# Patient Record
Sex: Female | Born: 2000 | Hispanic: Yes | Marital: Single | Smoking: Never smoker
Health system: Southern US, Community
[De-identification: ages and names within clinical notes are randomized; demographics above are authoritative.]

## PROBLEM LIST (undated history)

## (undated) DIAGNOSIS — E282 Polycystic ovarian syndrome: Secondary | ICD-10-CM

## (undated) HISTORY — DX: Polycystic ovarian syndrome: E28.2

---
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
## 2020-02-09 ENCOUNTER — Other Ambulatory Visit: Payer: Self-pay | Admitting: Nurse Practitioner

## 2020-02-09 DIAGNOSIS — R102 Pelvic and perineal pain: Secondary | ICD-10-CM

## 2020-02-17 ENCOUNTER — Other Ambulatory Visit: Payer: Self-pay

## 2020-07-21 ENCOUNTER — Ambulatory Visit (INDEPENDENT_AMBULATORY_CARE_PROVIDER_SITE_OTHER): Payer: Self-pay | Admitting: Obstetrics and Gynecology

## 2020-07-21 ENCOUNTER — Encounter: Payer: Self-pay | Admitting: *Deleted

## 2020-07-21 ENCOUNTER — Other Ambulatory Visit: Payer: Self-pay

## 2020-07-21 VITALS — BP 115/69 | HR 83 | Ht 62.0 in | Wt 193.1 lb

## 2020-07-21 DIAGNOSIS — Z3A01 Less than 8 weeks gestation of pregnancy: Secondary | ICD-10-CM

## 2020-07-21 DIAGNOSIS — E282 Polycystic ovarian syndrome: Secondary | ICD-10-CM

## 2020-07-21 LAB — POCT PREGNANCY, URINE: Preg Test, Ur: POSITIVE — AB

## 2020-07-21 NOTE — Progress Notes (Signed)
Patient was assessed and managed by nursing staff during this encounter. I have reviewed the chart and agree with the documentation and plan. I have also made any necessary editorial changes.  Patient advised to take prenatal vitamins and to consume a healthy diet. Patient to establish care to start prenatal care  Belinda Antigua, MD 07/21/2020 3:18 PM

## 2020-07-21 NOTE — Progress Notes (Signed)
Here for pregnancy test which was positive. States LMP 06/12/20 and is sure of that date. This makes her [redacted]w[redacted]d with EDD 03/19/21. She is not taking any medications.Advised take PNV. Advised to start prenatal vitamins. Dr.Constant in to see patient since not seen in our office before. Rodarius Kichline,RN

## 2020-07-21 NOTE — Patient Instructions (Signed)
Prenatal Care Providers           [facility]  [address] [phone]  [facility]   [address]  [phone]  [facility] @ [place]       [address] [phone]            [facility] @ [place]     [address] [phone]          [facility] @ [place]   [address] [phone]  [facility]  [address] [phone]     [facility])  [address]   [phone]     [facility]  Phone: [phone]  [facility]  Phone: [phone]  [facility] Phone: [phone]  [facility] Phone: [phone]  [facility] Phone: [phone]  [facility] Phone: [phone]  [facility]  Phone: [phone]  

## 2020-07-26 ENCOUNTER — Encounter: Payer: Self-pay | Admitting: Advanced Practice Midwife

## 2020-07-26 DIAGNOSIS — Z3480 Encounter for supervision of other normal pregnancy, unspecified trimester: Secondary | ICD-10-CM | POA: Insufficient documentation

## 2020-08-26 ENCOUNTER — Other Ambulatory Visit (HOSPITAL_COMMUNITY)
Admission: RE | Admit: 2020-08-26 | Discharge: 2020-08-26 | Disposition: A | Discharge status: Home / Self Care | Payer: Self-pay | Source: Ambulatory Visit | Attending: Advanced Practice Midwife | Admitting: Advanced Practice Midwife

## 2020-08-26 ENCOUNTER — Encounter: Payer: Self-pay | Admitting: Family Medicine

## 2020-08-26 ENCOUNTER — Ambulatory Visit (INDEPENDENT_AMBULATORY_CARE_PROVIDER_SITE_OTHER): Payer: Self-pay | Admitting: Family Medicine

## 2020-08-26 ENCOUNTER — Other Ambulatory Visit: Payer: Self-pay

## 2020-08-26 VITALS — BP 122/72 | HR 81 | Wt 193.0 lb

## 2020-08-26 DIAGNOSIS — Z3480 Encounter for supervision of other normal pregnancy, unspecified trimester: Secondary | ICD-10-CM | POA: Insufficient documentation

## 2020-08-26 DIAGNOSIS — Z9049 Acquired absence of other specified parts of digestive tract: Secondary | ICD-10-CM | POA: Insufficient documentation

## 2020-08-26 DIAGNOSIS — Z23 Encounter for immunization: Secondary | ICD-10-CM

## 2020-08-26 DIAGNOSIS — E282 Polycystic ovarian syndrome: Secondary | ICD-10-CM

## 2020-08-26 LAB — POCT URINALYSIS DIP (DEVICE)
Bilirubin Urine: NEGATIVE
Glucose, UA: NEGATIVE mg/dL
Hgb urine dipstick: NEGATIVE
Ketones, ur: NEGATIVE mg/dL
Nitrite: NEGATIVE
Protein, ur: NEGATIVE mg/dL
Specific Gravity, Urine: 1.02 (ref 1.005–1.030)
Urobilinogen, UA: 0.2 mg/dL (ref 0.0–1.0)
pH: 7 (ref 5.0–8.0)

## 2020-08-26 MED ORDER — PROMETHAZINE HCL 12.5 MG PO TABS
12.5000 mg | ORAL_TABLET | Freq: Four times a day (QID) | ORAL | 1 refills | Status: DC | PRN
Start: 2020-08-26 — End: 2020-10-21

## 2020-08-26 NOTE — Progress Notes (Signed)
   Subjective:  Belinda May is a 19 y.o. G1P0000 at [redacted]w[redacted]d being seen today for ongoing prenatal care.  She is currently monitored for the following issues for this low-risk pregnancy and has PCOS (polycystic ovarian syndrome); Supervision of other normal pregnancy, antepartum; and History of appendectomy on their problem list.  Patient reports nausea.  Contractions: Not present. Vag. Bleeding: None.  Movement: Absent. Denies leaking of fluid.   The following portions of the patient's history were reviewed and updated as appropriate: allergies, current medications, past family history, past medical history, past social history, past surgical history and problem list. Problem list updated.  Objective:   Vitals:   08/26/20 1034  BP: 122/72  Pulse: 81  Weight: 193 lb (87.5 kg)    Fetal Status: Fetal Heart Rate (bpm): 172   Movement: Absent     General:  Alert, oriented and cooperative. Patient is in no acute distress.  Skin: Skin is warm and dry. No rash noted.   Cardiovascular: Normal heart rate noted  Respiratory: Normal respiratory effort, no problems with respiration noted  Abdomen: Soft, gravid, appropriate for gestational age. Pain/Pressure: Absent     Pelvic: Vag. Bleeding: None     Cervical exam deferred        Extremities: Normal range of motion.  Edema: None  Mental Status: Normal mood and affect. Normal behavior. Normal judgment and thought content.   Urinalysis:      Assessment and Plan:  Pregnancy: G1P0000 at [redacted]w[redacted]d  1. Supervision of other normal pregnancy, antepartum Unplanned but desired pregnancy  Reviewed structure of Faculty practice with patient, that they may not always see the same provider, and that physicians, fellows, CNM, NP, PA, residents, and medical students may all be involved in their care. Made aware we only deliver at [facility], and to go to entrance C if needs to go to hospital Accepts genetic screening Accepts flu vaccine today Reports she  had COVID vaccine in March/April of this year, discuss booster at next visit Anatomy scan ordered - CBC/D/Plt+RPR+Rh+ABO+Rub Ab... - Genetic Screening - GC/Chlamydia probe amp ([place])not at [facility] - Culture, OB Urine - [place] MFM OB COMP + 14 WK; Future  2. History of appendectomy   3. PCOS (polycystic ovarian syndrome)   Preterm labor symptoms and general obstetric precautions including but not limited to vaginal bleeding, contractions, leaking of fluid and fetal movement were reviewed in detail with the patient. Please refer to After Visit Summary for other counseling recommendations.  Return in 4 weeks (on 09/23/2020) for [facility], ob visit.   Venora Maples, MD

## 2020-08-26 NOTE — Patient Instructions (Signed)
Primer trimestre de Psychiatrist First Trimester of Pregnancy El primer trimestre de Psychiatrist se extiende desde la semana1 hasta el final de la semana13 (mes1 al mes3). Una semana despus de que un espermatozoide fecunda un vulo, este se implantar en la pared uterina. Este embrin comenzar a Camera operator convertirse en un beb. Sus genes y los de su pareja forman el beb. Los genes del varn determinan si ser un nio o una nia. Entre la semana6 y [place], se forman los ojos y Recruitment consultant, y los latidos del corazn pueden verse en una ecografa. Al final de las 12semanas, todos los rganos del beb estn formados. Ahora que est embarazada, querr hacer todo lo que est a su alcance para tener un beb sano. Dos de las cosas ms importantes son Wilburt Finlay un buen cuidado prenatal y seguir las indicaciones del mdico. El cuidado prenatal incluye toda la asistencia mdica que usted recibe antes del nacimiento del beb. Esto ayudar a Radio producer, Engineer, manufacturing y tratar cualquier problema durante el embarazo y [place]. Durante Financial risk analyst trimestre, ocurren cambios en el cuerpo. Su organismo atraviesa por muchos cambios durante el [place]. Estos cambios varan de [place] a [place].  Al principio, puede aumentar o bajar algunos kilos.  Puede tener Programme researcher, broadcasting/film/video (nuseas) y vomitar. Si no puede controlar los vmitos, llame al mdico.  Puede cansarse con facilidad.  Es posible que tenga dolores de cabeza que pueden aliviarse con ciertos medicamentos. Todos los United Parcel que tome deben estar aprobados por el mdico.  Puede orinar con mayor frecuencia. El dolor al orinar puede significar que usted tiene una infeccin de la vejiga.  Debido al Vanetta Mulders, puede tener acidez estomacal.  Puede estar estreida, ya que ciertas hormonas enlentecen los movimientos de los msculos que empujan las heces a travs del intestino.  Pueden aparecer hemorroides o hincharse las venas (venas varicosas).  Las ConAgra Foods  pueden empezar a Government social research officer y Emergency planning/management officer. Los pezones pueden sobresalir ms, y el tejido que los rodea (arola) tornarse ms oscuro.  Las Veterinary surgeon y estar sensibles al cepillado y al hilo dental.  Pueden aparecer zonas oscuras o manchas (cloasma, mscara del [place]) en el rostro. Esto probablemente se atenuar despus del nacimiento del beb.  Los perodos menstruales se interrumpirn.  Tal vez no tenga apetito.  Puede sentir un fuerte deseo de consumir ciertos alimentos.  Puede tener cambios a Theatre manager a da, por ejemplo, por momentos puede estar emocionada por el Psychiatrist y por otros preocuparse porque algo pueda salir mal con el embarazo o el beb.  Tendr sueos ms vvidos y extraos.  Tal vez haya cambios en el cabello. Esto cambios pueden incluir su engrosamiento, crecimiento rpido y Allied Waste Industries textura. Adems, a algunas mujeres se les cae el cabello durante o despus del embarazo, o tienen el cabello seco o fino. Lo ms probable es que el cabello se le normalice despus del nacimiento del beb. Qu debe esperar en las visitas prenatales Durante una visita prenatal de rutina:  La pesarn para asegurarse de que usted y el beb estn creciendo normalmente.  Le tomarn la presin arterial.  Le medirn el abdomen para controlar el desarrollo del beb.  Se escucharn los latidos cardacos fetales entre las semanas10 y14 de [place].  Se analizarn los resultados de los estudios solicitados en visitas anteriores. El mdico puede preguntarle lo siguiente:  Cmo se siente.  Si siente los movimientos del beb.  Si ha tenido sntomas anormales, como prdida de lquido,  sangrado, dolores de cabeza intensos o clicos abdominales.  Si est consumiendo algn producto que contenga tabaco, como cigarrillos, tabaco de Theatre managermascar y Administrator, Civil Servicecigarrillos electrnicos.  Si tiene Colgate-Palmolivealguna pregunta. Otros estudios que pueden realizarse durante el primer trimestre  incluyen lo siguiente:  Anlisis de sangre para determinar el grupo sanguneo y Engineer, manufacturingdetectar la presencia de infecciones previas. Las pruebas tambin se utilizarn para Chief Strategy Officerdeterminar si tiene bajo nivel de hierro (anemia) y protenas en los glbulos rojos (anticuerpos Rh). En funcin de sus factores de riesgo, o si ya tuvo diabetes durante un embarazo anterior, le pueden hacer pruebas para determinar si tiene un nivel alto de azcar en la sangre, algo que puede afectar a embarazadas (diabetes gestacional).  Anlisis de orina para detectar infecciones, diabetes o protenas en la orina.  Una ecografa para confirmar que el beb crece y se desarrolla correctamente.  Estudios fetales para Engineer, manufacturingdetectar problemas de la mdula espinal (espina bfida) y sndrome de [place].  Prueba del VIH (virus de inmunodeficiencia humana). Los exmenes prenatales de rutina incluyen la prueba de deteccin del VIH, a menos que decida no Futures traderrealizrsela.  Es posible que necesite otras pruebas adicionales. Siga estas instrucciones en su casa: Medicamentos  Siga las indicaciones del mdico en relacin con el uso de medicamentos. Durante el embarazo, hay medicamentos que pueden tomarse y otros que no.  Tome vitaminas prenatales que contengan por lo menos 600microgramos (?g) de cido flico.  Si est estreida, tome un laxante suave, si el mdico lo autoriza. Comida y bebida   Esperanza RichtersLleve una dieta equilibrada que incluya gran cantidad de frutas y verduras frescas, cereales integrales, buenas fuentes de protenas como carnes Doylinemagras, huevos o tofu, y lcteos descremados. El mdico la ayudar a Production assistant, radiodeterminar la cantidad de peso que puede Altamontaumentar.  No coma carne cruda ni quesos sin cocinar. Estos elementos contienen grmenes que pueden causar defectos congnitos en el beb.  La ingesta diaria de cuatro o cinco comidas pequeas en lugar de tres comidas abundantes puede ayudar a Yahooaliviar las nuseas y los vmitos. Si empieza a tener nuseas,  comer algunas [address] Avenuegalletas saladas puede ser de Knoxayuda. Beber lquidos Altria Groupentre las comidas, Teacher, English as a foreign languageen lugar de tomarlos durante las comidas, tambin puede ayudar a Paramedicaliviar las nuseas y los vmitos.  Limite el consumo de alimentos con alto contenido de grasas y azcares procesados, como alimentos fritos o dulces.  Para evitar el estreimiento: ? Consuma alimentos ricos en fibra, como frutas y verduras frescas, cereales integrales y frijoles. ? Beba suficiente lquido para mantener la orina clara o de color amarillo plido. Actividad  Haga ejercicio solamente como se lo haya indicado el mdico. La mayora de las mujeres pueden continuar su rutina de ejercicios durante el Watagaembarazo. Intente realizar como mnimo 30minutos de actividad fsica por lo menos 5das a la semana. El ejercicio la ayudar a: ? Engineering geologistControlar su peso. ? Mantenerse en forma. ? Estar preparada para el trabajo de parto y Leeds Pointel parto.  Los dolores, los clicos en la parte baja del abdomen o los calambres en la cintura son un buen indicio de que debe dejar de Corporate treasurerhacer ejercicios. Consulte al mdico antes de seguir haciendo ejercicios con normalidad.  Intente no estar de pie FedExdurante mucho tiempo. Mueva las piernas con frecuencia si debe estar de pie en un lugar durante mucho tiempo.  Evite levantar pesos Fortune Brandsexcesivos.  Use zapatos de tacones bajos y Brazilmantenga una buena postura.  Puede seguir teniendo The St. Paul Travelersrelaciones sexuales, salvo que el mdico le indique lo contrario. Alivio del dolor y del  malestar  Use un sostn que le brinde buen soporte para Engineer, materials de [place].  Dese baos de asiento con agua tibia para Engineer, materials o las molestias causadas por las hemorroides. Use una crema para las hemorroides si el mdico la autoriza.  Descanse con las piernas elevadas si tiene calambres o dolor de cintura.  Si tiene venas varicosas en las piernas, use medias de descanso. Eleve los pies durante , 3 o 4veces por da. Limite el consumo de  sal en su dieta. Cuidados prenatales  Programe las visitas prenatales para la semana12 de [place]. Generalmente se programan cada mes al principio y se hacen ms frecuentes en los 2 ltimos meses antes del parto.  Escriba sus preguntas. Llvelas cuando concurra a las visitas prenatales.  Concurra a todas las visitas prenatales tal como se lo haya indicado el mdico. Esto es importante. Seguridad  Use el cinturn de seguridad en todo momento mientras conduce.  Haga una lista de los nmeros de telfono de Associate Professor, que [employer] nmeros de telfono de familiares, [place], el hospital y los departamentos de polica y bomberos. Instrucciones generales  Pdale al mdico que la derive a clases de educacin prenatal en su localidad. Debe comenzar a tomar las clases antes de que empiece el mes6 de [place].  Pida ayuda si tiene necesidades nutricionales o de asesoramiento Academic librarian. El mdico puede aconsejarla o derivarla a especialistas para que la ayuden con diferentes necesidades.  No se d baos de inmersin en agua caliente, baos turcos ni saunas.  No se haga duchas vaginales ni use tampones o toallas higinicas perfumadas.  No mantenga las piernas cruzadas durante [place].  Evite el contacto con las bandejas sanitarias de los gatos y la tierra que estos animales usan. Estos elementos contienen bacterias que pueden causar defectos congnitos al beb y la posible prdida del feto debido a un aborto espontneo o muerte fetal.  No fume, no consuma hierbas ni medicamentos que no hayan sido recetados por el mdico. Las sustancias qumicas que estos productos contienen afectan la formacin y el desarrollo del beb.  No consuma ningn producto que contenga nicotina o tabaco, como cigarrillos y Administrator, Civil Service. Si necesita ayuda para dejar de fumar, consulte al American Express. Puede recibir asesoramiento y otro tipo de recursos para dejar de fumar.  Programe una cita con el  dentista. En su casa, lvese los dientes con un cepillo dental blando y psese el hilo dental con suavidad. Comunquese con un mdico si:  Tiene mareos.  Siente clicos leves, presin en la pelvis o dolor persistente en el abdomen.  Tiene nuseas, vmitos o diarrea persistentes.  Brett Fairy secrecin vaginal con mal olor.  Siente dolor al ConocoPhillips.  Observa ms hinchazn en la cara, las manos, las piernas o los tobillos.  Est expuesta a la quinta enfermedad o a la varicela.  Est expuesta al sarampin alemn (rubola) y nunca lo haba tenido. Solicite ayuda de inmediato si:  Tiene fiebre.  Tiene una prdida de lquido por la vagina.  Tiene sangrado o pequeas prdidas vaginales.  Siente dolor intenso o clicos en el abdomen.  Sube o baja de peso rpidamente.  Vomita sangre de color rojo brillante o una sustancia similar a los granos de caf.  Dolor de cabeza intenso.  Le falta el aire.  Sufre cualquier tipo de traumatismo, por ejemplo, debido a una cada o un accidente automovilstico. Resumen  El primer trimestre de Psychiatrist se extiende desde la semana1 hasta el final  de la semana13 (mes1 al mes3).  Su organismo atraviesa por muchos cambios durante el [place]. Estos cambios varan de [place] a [place].  Tendr visitas prenatales de rutina. Durante esas visitas, el mdico la examinar, hablar con usted acerca de los resultados de sus pruebas y Network engineer cmo se siente. Esta informacin no tiene Theme park manager el consejo del mdico. Asegrese de hacerle al mdico cualquier pregunta que tenga. Document Revised: 11/30/2016 Document Reviewed: 11/30/2016 Elsevier Patient Education  2020 [employer].   [employer] del mtodo anticonceptivo Contraception Choices La anticoncepcin, o los mtodos anticonceptivos, hace referencia a los mtodos o dispositivos que evitan el [place]. Mtodos hormonales Implante anticonceptivo  Un implante anticonceptivo  consiste en un tubo plstico delgado que contiene una hormona. Se inserta en la parte superior del brazo. Puede Consulting civil engineer por 3 aos. Inyecciones de progestina sola Las inyecciones de progestina sola contienen progestina, una forma sinttica de la hormona progesterona. Un mdico las administra cada 3 meses. Pldoras anticonceptivas  Las pldoras anticonceptivas son pastillas que contienen hormonas que evitan el [place]. Deben tomarse una vez al da, preferentemente a la misma Economist. Parches anticonceptivos  El parche anticonceptivo contiene hormonas que evitan el [place]. Se coloca en la piel, debe cambiarse una vez a la semana durante tres semanas y debe retirarse en la cuarta semana. Se necesita una receta para utilizar este mtodo anticonceptivo. Anillo vaginal  Un anillo vaginal contiene hormonas que evitan el embarazo. Se coloca en la vagina durante tres semanas y se retira en la cuarta semana. Luego se repite el proceso con un anillo nuevo. Se necesita una receta para utilizar este mtodo anticonceptivo. Anticonceptivo de emergencia Los anticonceptivos de emergencia son mtodos para evitar un embarazo despus de Warehouse manager sexo sin proteccin. Vienen en forma de pldora y pueden tomarse hasta 5 das despus de [place]. Funcionan mejor cuando se toman lo ms pronto posible luego de eBay. La mayora de los anticonceptivos de emergencia estn disponibles sin receta mdica. Este mtodo no debe utilizarse como el nico mtodo anticonceptivo. Mtodos de barrera Preservativo masculino  Un preservativo masculino es una vaina delgada que se coloca sobre el pene durante el sexo. Los preservativos evitan que el esperma ingrese en el cuerpo de la [place]. Pueden utilizarse con un espermicida para aumentar la efectividad. Deben desecharse luego de su uso. Preservativo femenino  Un preservativo femenino es una vaina blanda y holgada que se coloca en la vagina antes de [place]. El preservativo evita que el esperma ingrese en el cuerpo de la [place]. Deben desecharse luego de su uso. Diafragma  Un diafragma es una barrera blanda con forma de cpula. Se inserta en la vagina antes del sexo, junto con un espermicida. El diafragma bloquea el ingreso de esperma en el tero, y el espermicida mata a los espermatozoides. El Designer, fashion/clothing en la vagina durante 6 a 8 horas despus de Warehouse manager sexo y debe retirarse en el plazo de las 24 horas. Un diafragma es recetado y colocado por un mdico. Debe reemplazarse cada 1 a 2 aos, despus de dar a luz, de aumentar ms de 15lb (6,8kg) y de [place] plvica. Capuchn cervical  Un capuchn cervical es una copa redonda y blanda de ltex o plstico que se coloca en el cuello uterino. Se inserta en la vagina antes del sexo, junto con un espermicida. Bloquea el ingreso del esperma en el tero. El capuchn Radio producer durante 6  a 8 horas despus de tener sexo y debe retirarse en el plazo de las 48 horas. Un capuchn cervical debe ser recetado y colocado por un mdico. Debe reemplazarse cada 2aos. Esponja  Una esponja es una pieza blanda y circular de espuma de poliuretano que contiene espermicida. La esponja ayuda a bloquear el ingreso de esperma en el tero, y el espermicida mata a los espermatozoides. Belva Bertin, debe humedecerla e insertarla en la vagina. Debe insertarse antes de eBay, debe permanecer dentro al menos durante 6 horas despus de tener sexo y debe retirarse y Nurse, adult en el plazo de las 30 horas. Espermicidas Los espermicidas son sustancias qumicas que matan o bloquean al esperma y no lo dejan ingresar al cuello uterino y al tero. Vienen en forma de crema, gel, supositorio, espuma o comprimido. Un espermicida debe insertarse en la vagina con un aplicador al menos 10 o 15 minutos antes de tener sexo para dar tiempo a que surta [place]. El proceso debe repetirse cada vez que tenga sexo.  Los espermicidas no requieren Emergency planning/management officer. Anticonceptivos intrauterinos Dispositivo intrauterino (DIU). Un DIU es un dispositivo en forma de T que se coloca en el tero. Existen dos tipos:  DIU hormonal.Este tipo contiene progestina, una forma sinttica de la hormona progesterona. Este tipo puede permanecer colocado durante 3 a 5 aos.  DIU de cobre.Este tipo est recubierto con un alambre de cobre. Puede permanecer colocado durante 10 aos.  Mtodos anticonceptivos permanentes Ligadura de trompas en la mujer En este mtodo, se sellan, atan u obstruyen las trompas de Falopio durante una ciruga para Automotive engineer que el vulo descienda [place]. Esterilizacin histeroscpica En este mtodo, se coloca un implante pequeo y flexible dentro de cada trompa de Falopio. Los implantes hacen que se forme un tejido cicatricial en las trompas de Falopio y que las obstruya para que el espermatozoide no pueda llegar al vulo. El procedimiento demora alrededor de 3 meses para que sea [place]. Debe utilizarse otro mtodo anticonceptivo durante esos 3 meses. Esterilizacin masculina Este es un procedimiento que consiste en atar los conductos que transportan el esperma (vasectoma). Luego del procedimiento, el hombre Manufacturing engineer lquido (semen). Mtodos de planificacin natural Planificacin familiar natural En este mtodo, la pareja no tiene [employer] Family [employer] la mujer podra quedar [place]. Mtodo calendario Marketing executive un seguimiento de la duracin de cada ciclo menstrual, identificar los [employer] que se puede producir un Psychiatrist y no Warehouse manager sexo durante esos [place]. Mtodo de la ovulacin En este mtodo, la pareja evita tener sexo durante la ovulacin. Mtodo sintotrmico Este mtodo implica no tener sexo durante la ovulacin. Normalmente, la mujer comprueba la ovulacin al observar cambios en su temperatura y en la consistencia del moco cervical. Mtodo posovulacin En  este mtodo, la pareja espera a que finalice la ovulacin para Doctor, hospital. Resumen  La anticoncepcin, o los mtodos anticonceptivos, hace referencia a los mtodos o dispositivos que evitan el [place].  Los mtodos anticonceptivos hormonales incluyen implantes, inyecciones, pastillas, parches, anillos vaginales y anticonceptivos de Associate Professor.  Los mtodos anticonceptivos de barrera pueden incluir preservativos masculinos, preservativos femeninos, diafragmas, capuchones cervicales, esponjas y espermicidas.  Guardian Life Insurance tipos de DIU (dispositivo intrauterino). Un DIU puede colocarse en el tero de una mujer para evitar el embarazo durante 3 a 5 aos.  La esterilizacin permanente puede realizarse mediante un procedimiento para hombres, mujeres o ambos.  Los [employer]ger de Medical sales representative natural incluyen no Doctor, hospital [employer] Electric Power en  que la mujer podra quedar [place]. Esta informacin no tiene Theme park manager el consejo del mdico. Asegrese de hacerle al mdico cualquier pregunta que tenga. Document Revised: 09/28/2017 Document Reviewed: 12/17/2016 Elsevier Patient Education  2020 Elsevier Inc.   [place] materna Breastfeeding  Decidir amamantar es una de las mejores elecciones que puede hacer por usted y su beb. Un cambio en las hormonas durante el embarazo hace que las mamas produzcan leche materna en las glndulas productoras de [place]. Las hormonas impiden que la leche materna sea liberada antes del nacimiento del beb. Adems, impulsan el flujo de leche luego del nacimiento. Una vez que ha comenzado a Museum/gallery exhibitions officer, Conservation officer, nature beb, as Immunologist succin o Theatre manager, pueden estimular la liberacin de [place] [employer] de las glndulas productoras de [place]. Los beneficios de [employer] International investigaciones demuestran que la lactancia materna ofrece muchos beneficios de salud para bebs y [place]. Adems, ofrece una forma gratuita y conveniente de Corporate treasurer al beb. Para el beb  La  primera leche (calostro) ayuda a Careers information officer funcionamiento del aparato digestivo del beb.  Las clulas especiales de la leche (anticuerpos) ayudan a Artist las infecciones en el beb.  Los bebs que se alimentan con leche materna tambin tienen menos probabilidades de tener asma, alergias, obesidad o diabetes de tipo 2. Adems, tienen menor riesgo de sufrir el sndrome de muerte sbita del lactante (SMSL).  Los nutrientes de la [place] materna son mejores para Patent examiner las necesidades del beb en comparacin con la [employer].  La leche materna mejora el desarrollo cerebral del beb. Para usted  La lactancia materna favorece el desarrollo de un vnculo muy especial entre la madre y el beb.  Es conveniente. La leche materna es econmica y siempre est disponible a la Human resources officer.  La lactancia materna ayuda a quemar caloras. Claude [place]anges a perder el peso ganado durante el [place].  Hace que el tero vuelva al tamao que tena antes del embarazo ms rpido. Adems, disminuye el sangrado (loquios) despus del parto.  La lactancia materna contribuye a reducir Nurse, adult de tener diabetes de tipo 2, osteoporosis, artritis reumatoide, enfermedades cardiovasculares y cncer de mama, ovario, tero y endometrio en el futuro. Informacin bsica sobre la lactancia Comienzo de la lactancia  Encuentre un lugar cmodo para sentarse o Teacher, music, con un buen respaldo para el cuello y la espalda.  Coloque una almohada o una manta enrollada debajo del beb para acomodarlo a la altura de la mama (si est sentada). Las almohadas para Museum/gallery exhibitions officer se han diseado especialmente a fin de servir de apoyo para los brazos y el beb [employer] Foods.  Asegrese de que la barriga del beb (abdomen) est frente a la suya.  [place] suavemente la mama. Con las yemas de los dedos, Liberty Media bordes exteriores de la mama hacia adentro, en direccin al pezn. Esto estimula el flujo de [place]. Si la Kimberly[employer]ark, es posible que deba Educational psychologist con este movimiento durante la Market researcher.  Sostenga la mama con 4 dedos por debajo y Multimedia programmer por arriba del pezn (forme la letra "C" con la mano). Asegrese de que los dedos se encuentren lejos del pezn y de la boca del beb.  Empuje suavemente los labios del beb con el pezn o con el dedo.  Cuando la boca del beb se abra lo suficiente, acrquelo rpidamente a la mama e introduzca todo el pezn y la arola, tanto como sea posible, dentro de la boca del beb. La arola es la  zona de color que rodea al pezn. ? Debe haber ms arola visible por arriba del labio superior del beb que por debajo del labio inferior. ? Los labios del beb deben estar abiertos y extendidos hacia afuera (evertidos) para asegurar que el beb se prenda de forma adecuada y cmoda. ? La lengua del beb debe estar entre la enca inferior y Educational psychologistla mama.  Asegrese de que la boca del beb est en la posicin correcta alrededor del pezn (prendido). Los labios del beb deben crear un sello sobre la mama y estar doblados hacia afuera (invertidos).  Es comn que el beb succione durante 2 a 3 minutos para que comience el flujo de Minongleche materna. Cmo debe prenderse Es muy importante que le ensee al beb cmo prenderse adecuadamente a la mama. Si el beb no se prende adecuadamente, puede causar Federated Department Storesdolor en los pezones, reducir la produccin de [place]leche materna y Radio producerhacer que el beb tenga un escaso aumento de Redlandspeso. Adems, si el beb no se prende adecuadamente al pezn, puede tragar aire durante la alimentacin. Esto puede causarle molestias al beb. Hacer eructar al beb al Pilar Platecambiar de mama puede ayudarlo a liberar el aire. Sin embargo, ensearle al beb cmo prenderse a la mama adecuadamente es la mejor manera de evitar que se sienta molesto por tragar Oceanographeraire mientras se alimenta. Signos de que el beb se ha prendido adecuadamente al pezn  Tironea o succiona de modo silencioso, sin Publishing copycausarle  dolor. Los labios del beb deben estar extendidos hacia afuera (evertidos).  Se escucha que traga cada 3 o 4 succiones una vez que la WPS Resourcesleche ha comenzado a Radiographer, therapeuticfluir (despus de que se produzca el reflejo de eyeccin de la [place]).  Hay movimientos musculares por arriba y por delante de sus odos al Printmakersuccionar. Signos de que el beb no se ha prendido Audiological scientistadecuadamente al pezn  Hace ruidos de succin o de chasquido mientras se Tree surgeonalimenta.  Siente dolor en los pezones. Si cree que el beb no se prendi correctamente, deslice el dedo en la comisura de la boca y Ameren Corporationcolquelo entre las encas del beb para interrumpir la succin. Intente volver a comenzar a Museum/gallery exhibitions officeramamantar. Signos de Market researcherlactancia materna exitosa Signos del beb  El beb disminuir gradualmente el nmero de succiones o dejar de succionar por completo.  El beb se quedar dormido.  El cuerpo del beb se relajar.  El beb retendr Neomia Dearuna pequea cantidad de Kindred Healthcareleche en la boca.  El beb se desprender solo del [place]. Signos que presenta usted  Las mamas han aumentado la firmeza, el peso y el tamao 1 a 3 horas despus de Museum/gallery exhibitions officeramamantar.  Estn ms blandas inmediatamente despus de amamantar.  Se producen un aumento del volumen de [place] y un cambio en su consistencia y color hacia el quinto da de Market researcherlactancia.  Los pezones no duelen, no estn agrietados ni sangran. Signos de que su beb recibe la cantidad de leche suficiente  Mojar por lo menos 1 o 2paales durante las primeras 24horas despus del nacimiento.  Mojar por lo menos 5 o 6paales cada 24horas durante la primera semana despus del nacimiento. La orina debe ser clara o de color amarillo plido a los 5das de vida.  Mojar entre 6 y 8paales cada 24horas a medida que el beb sigue creciendo y desarrollndose.  Defeca por lo menos 3 veces en 24 horas a los 5 [address]  [address] de [address]. Las heces deben ser blandas y Armed forces operational officeramarillentas.  Defeca por lo menos 3 veces en 24 horas a los  7 das de [address] Las heces  deben ser grumosas y Armed forces operational officer.  No registra una prdida de peso mayor al 10% del peso al nacer durante los primeros 3 [address]  [address] de [place].  Aumenta de peso un promedio de 4 a 7onzas (113 a 198g) por semana despus de los 4 [address]  [address] de vida.  Aumenta de [place], [place], de [place]um uniforme a Glass blower/designer de los 5 [address]  [address] de vida, sin Passenger transport manager prdida de peso despus de las 2semanas de vida. Despus de alimentarse, es posible que el beb regurgite una pequea cantidad de [place]. Esto es normal. Frecuencia y duracin de la lactancia El amamantamiento frecuente la ayudar a producir ms [place] y puede prevenir dolores en los pezones y las mamas extremadamente llenas (congestin [place]). Alimente al beb cuando muestre signos de hambre o si siente la necesidad de reducir la congestin de las [place]. Esto se denomina "lactancia a demanda". Las seales de que el beb tiene hambre incluyen las siguientes:  Aumento del [place] de [place], [place] o inquietud.  Mueve la cabeza de un lado a otro.  Abre la boca cuando se le toca la mejilla o la comisura de la boca (reflejo de bsqueda).  Aumenta las vocalizaciones, tales como sonidos de succin, se relame los labios, emite arrullos, suspiros o chirridos.  Mueve la [employer] boca y se chupa los dedos o las manos.  Est molesto o llora. Evite el uso del chupete en las primeras 4 a 6 semanas despus del nacimiento del beb. Despus de este perodo, podr usar un chupete. Las investigaciones demostraron que el uso del chupete durante Financial risk analyst ao de vida del beb disminuye el riesgo de tener el sndrome de muerte sbita del lactante (SMSL). Permita que el nio se alimente en cada mama todo lo que desee. Cuando el beb se desprende o se queda dormido mientras se est alimentando de la primera mama, ofrzcale la segunda. Debido a que, con frecuencia, los recin nacidos estn somnolientos las primeras semanas de vida, es posible que deba despertar al beb para  alimentarlo. Los horarios de Acupuncturist de un beb a otro. Sin embargo, las siguientes reglas pueden servir como gua para ayudarla a Lawyer que el beb se alimenta adecuadamente:  Se puede amamantar a los recin nacidos (bebs de 4 semanas o menos de vida) cada 1 a 3 horas.  No deben transcurrir ms de 3 horas durante el da o 5 horas durante la noche sin que se amamante a los recin nacidos.  Debe amamantar al beb un mnimo de 8 veces en un perodo de 24 horas. Extraccin de [employer] Companies extraccin y Contractor de la leche materna le permiten asegurarse de que el beb se alimente exclusivamente de su leche materna, aun en momentos en los que no puede Museum/gallery exhibitions officer. Esto tiene especial importancia si debe regresar al Aleen Campi en el perodo en que an est amamantando o si no puede estar presente en los momentos en que el beb debe alimentarse. Su asesor en lactancia puede ayudarla a Clinical research associate un mtodo de extraccin que funcione mejor para usted y Programmer, systems cunto tiempo es seguro almacenar [place]. Cmo cuidar las mamas durante la lactancia Los pezones pueden secarse, Lobbyist y doler durante la Market researcher. Las siguientes recomendaciones pueden ayudarla a Pharmacologist las [employer] y sanas:  Careers information officer usar jabn en los pezones.  Use un sostn de soporte diseado especialmente para la lactancia materna. Evite usar sostenes con aro o sostenes muy ajustados (sostenes  deportivos).  Seque al aire sus pezones durante 3 a despus de amamantar al beb.  Utilice solo apsitos de Haematologist sostn para Environmental health practitioner las prdidas de [place]. La prdida de un poco de [employer] tomas es normal.  Utilice lanolina sobre los pezones luego de Museum/gallery exhibitions officer. La lanolina ayuda a mantener la humedad normal de la piel. La lanolina pura no es perjudicial (no es txica) para el beb. Adems, puede extraer [employer] algunas gotas de [place] materna y Engineer, maintenance (IT)  suavemente esa [employer] pezones para que la [place] se seque al aire. Durante las primeras semanas despus del nacimiento, algunas mujeres experimentan Streetman. La congestin [place] [employer] puede hacer que sienta las mamas pesadas, calientes y sensibles al tacto. El pico de la congestin mamaria ocurre en el plazo de los 3 a 5 das despus del [place]. Las siguientes recomendaciones pueden ayudarla a Paramedic la congestin mamaria:  Vace por completo las mamas al [employer] o Environmental health practitioner. Puede aplicar calor hmedo en las mamas (en la ducha o con toallas hmedas para manos) antes de Museum/gallery exhibitions officer o extraer [employer]. Esto aumenta la circulacin y [place] a que la [place]. Si el beb no vaca por completo las [address] cuando lo [address], extraiga la [place] restante despus de que haya finalizado.  Aplique compresas de hielo [employer] inmediatamente despus de Museum/gallery exhibitions officer o extraer [place], a menos que le resulte demasiado incmodo. Haga lo siguiente: ? Ponga el hielo en una bolsa plstica. ? Coloque una [employer] piel y la bolsa de hielo. ? Coloque el hielo durante , 2 o 3veces por da.  Asegrese de que el beb est prendido y se encuentre en la posicin correcta mientras lo alimenta. Si la congestin mamaria persiste luego de 48 horas o despus de seguir estas recomendaciones, comunquese con su mdico o un Holiday representative. Recomendaciones de salud general durante la lactancia  Consuma 3 comidas y 3 colaciones saludables todos los [place]. Las [employer] Holdings bien alimentadas que amamantan necesitan entre 450 y 500 caloras adicionales por Futures trader. Puede cumplir con este requisito al aumentar la cantidad de una dieta equilibrada que realice.  Beba suficiente agua para mantener la orina clara o de color amarillo plido.  Descanse con frecuencia, reljese y siga tomando sus vitaminas prenatales para prevenir la fatiga, el estrs y los niveles bajos de vitaminas y [employer] en el cuerpo  (deficiencias de nutrientes).  No consuma ningn producto que contenga nicotina o tabaco, como cigarrillos y Administrator, Civil Service. El beb puede verse afectado por las sustancias qumicas de los cigarrillos que pasan a la [place] materna y por la exposicin al humo ambiental del tabaco. Si necesita ayuda para dejar de fumar, consulte al mdico.  Evite el consumo de alcohol.  No consuma drogas ilegales o marihuana.  Antes de Dietitian, hable con el mdico. Estos incluyen medicamentos recetados y de [place], como tambin vitaminas y suplementos a base de hierbas. Algunos medicamentos, que pueden ser perjudiciales para el beb, pueden pasar a travs de la [employer].  Puede quedar embarazada durante la lactancia. Si se desea un mtodo anticonceptivo, consulte al mdico sobre cules son las opciones seguras durante la Market researcher. Dnde encontrar ms informacin: Liga internacional [place]: [email or web address] Comunquese con un mdico si:  Siente que quiere dejar de Museum/gallery exhibitions officer o se siente frustrada con la lactancia.  Sus pezones estn agrietados o Water quality scientist.  Sus mamas estn irritadas, sensibles o calientes.  Tiene los siguientes sntomas: ? TEFL teacher  las [employer] o en los pezones. ? Un rea hinchada en cualquiera de las mamas. ? Grant Ruts o escalofros. ? Nuseas o vmitos. ? Drenaje de otro lquido distinto de la [employer] materna desde los pezones.  Sus mamas no se llenan antes de Museum/gallery exhibitions officer al beb para el quinto da despus del [place].  Se siente triste y deprimida.  El beb: ? Est demasiado somnoliento como para comer bien. ? Tiene problemas para dormir. ? Tiene ms de 1 semana de vida y [employer] de 6 paales en un periodo de 24 horas. ? No ha aumentado de [place] a los [address] de [address]  El beb defeca menos de 3 veces en 24 horas.  La piel del beb o las partes blancas de los ojos se vuelven amarillentas. Solicite ayuda de inmediato si:  El beb est muy cansado  Retail buyer) y no se quiere despertar para comer.  Le sube la fiebre sin causa. Resumen  La lactancia materna ofrece muchos beneficios de salud para bebs y [place].  Intente amamantar a su beb cuando muestre signos tempranos de hambre.  Haga cosquillas o empuje suavemente los labios del beb con el dedo o el pezn para lograr que el beb abra la boca. Acerque el beb a la mama. Asegrese de que la mayor parte de la arola se encuentre dentro de la boca del beb. Ofrzcale una mama y haga eructar al beb antes de pasar a la otra.  Hable con su mdico o asesor en lactancia si tiene dudas o problemas con la lactancia. Esta informacin no tiene Theme park manager el consejo del mdico. Asegrese de hacerle al mdico cualquier pregunta que tenga. Document Revised: 11/21/2017 Document Reviewed: 12/17/2016 Elsevier Patient Education  2020 [employer].

## 2020-08-27 LAB — CBC/D/PLT+RPR+RH+ABO+RUB AB...
Antibody Screen: NEGATIVE
Basophils Absolute: 0 10*3/uL (ref 0.0–0.2)
Basos: 0 %
EOS (ABSOLUTE): 0.1 10*3/uL (ref 0.0–0.4)
Eos: 1 %
HCV Ab: <0.1 s/co ratio (ref 0.0–0.9)
HIV Screen 4th Generation wRfx: NONREACTIVE
Hematocrit: 40.2 % (ref 34.0–46.6)
Hemoglobin: 13.4 g/dL (ref 11.1–15.9)
Hepatitis B Surface Ag: NEGATIVE
Immature Grans (Abs): 0 10*3/uL (ref 0.0–0.1)
Immature Granulocytes: 0 %
Lymphocytes Absolute: 1.9 10*3/uL (ref 0.7–3.1)
Lymphs: 23 %
MCH: 29.1 pg (ref 26.6–33.0)
MCHC: 33.3 g/dL (ref 31.5–35.7)
MCV: 87 fL (ref 79–97)
Monocytes Absolute: 0.6 10*3/uL (ref 0.1–0.9)
Monocytes: 7 %
Neutrophils Absolute: 5.6 10*3/uL (ref 1.4–7.0)
Neutrophils: 69 %
Platelets: 261 10*3/uL (ref 150–450)
RBC: 4.6 x10E6/uL (ref 3.77–5.28)
RDW: 13.7 % (ref 11.7–15.4)
RPR Ser Ql: NONREACTIVE
Rh Factor: POSITIVE
Rubella Antibodies, IGG: 2.12 index (ref >0.99)
WBC: 8.3 10*3/uL (ref 3.4–10.8)

## 2020-08-27 LAB — HCV INTERPRETATION

## 2020-08-28 LAB — URINE CULTURE, OB REFLEX

## 2020-08-28 LAB — CULTURE, OB URINE

## 2020-08-29 LAB — GC/CHLAMYDIA PROBE AMP (~~LOC~~) NOT AT ARMC
Chlamydia: NEGATIVE
Comment: NEGATIVE
Comment: NORMAL
Neisseria Gonorrhea: NEGATIVE

## 2020-09-06 ENCOUNTER — Encounter: Payer: Self-pay | Admitting: Family Medicine

## 2020-09-23 ENCOUNTER — Other Ambulatory Visit: Payer: Self-pay

## 2020-09-23 ENCOUNTER — Ambulatory Visit (INDEPENDENT_AMBULATORY_CARE_PROVIDER_SITE_OTHER): Payer: Self-pay | Admitting: Family Medicine

## 2020-09-23 VITALS — BP 115/82 | HR 105 | Wt 186.5 lb

## 2020-09-23 DIAGNOSIS — Z3480 Encounter for supervision of other normal pregnancy, unspecified trimester: Secondary | ICD-10-CM

## 2020-09-23 DIAGNOSIS — E282 Polycystic ovarian syndrome: Secondary | ICD-10-CM

## 2020-09-23 NOTE — Progress Notes (Signed)
Pt states having light pink spotting x 1 one week ago. Nothing now.

## 2020-09-23 NOTE — Progress Notes (Signed)
   PRENATAL VISIT NOTE  Subjective:  Belinda May is a 20 y.o. G1P0000 at [redacted]w[redacted]d being seen today for ongoing prenatal care.  She is currently monitored for the following issues for this low-risk pregnancy and has PCOS (polycystic ovarian syndrome); Supervision of other normal pregnancy, antepartum; and History of appendectomy on their problem list.  Patient reports no complaints.  Contractions: Not present. Vag. Bleeding: None.  Movement: Absent. Denies leaking of fluid.   The following portions of the patient's history were reviewed and updated as appropriate: allergies, current medications, past family history, past medical history, past social history, past surgical history and problem list.   Objective:   Vitals:   09/23/20 0849  BP: 115/82  Pulse: (!) 105  Weight: 186 lb 8 oz (84.6 kg)    Fetal Status: Fetal Heart Rate (bpm): 154   Movement: Absent     General:  Alert, oriented and cooperative. Patient is in no acute distress.  Skin: Skin is warm and dry. No rash noted.   Cardiovascular: Normal heart rate noted  Respiratory: Normal respiratory effort, no problems with respiration noted  Abdomen: Soft, gravid, appropriate for gestational age.  Pain/Pressure: Present     Pelvic: Cervical exam deferred        Extremities: Normal range of motion.  Edema: None  Mental Status: Normal mood and affect. Normal behavior. Normal judgment and thought content.   Assessment and Plan:  Pregnancy: G1P0000 at [redacted]w[redacted]d  1. Supervision of other normal pregnancy, antepartum Still some nausea. Wants to continue with current medications  2. PCOS (polycystic ovarian syndrome)   Preterm labor symptoms and general obstetric precautions including but not limited to vaginal bleeding, contractions, leaking of fluid and fetal movement were reviewed in detail with the patient. Please refer to After Visit Summary for other counseling recommendations.   No follow-ups on file.  Future  Appointments  Date Time Provider Department Center  10/28/2020 10:45 AM [facility]    Levie Heritage, DO

## 2020-10-17 ENCOUNTER — Telehealth: Payer: Self-pay

## 2020-10-17 NOTE — Telephone Encounter (Signed)
Created GFE for pt and made it available via MyChart.

## 2020-10-21 ENCOUNTER — Encounter: Payer: Self-pay | Admitting: Family Medicine

## 2020-10-21 ENCOUNTER — Other Ambulatory Visit: Payer: Self-pay

## 2020-10-21 ENCOUNTER — Ambulatory Visit (INDEPENDENT_AMBULATORY_CARE_PROVIDER_SITE_OTHER): Payer: Self-pay | Admitting: Family Medicine

## 2020-10-21 VITALS — BP 128/72 | HR 91 | Wt 185.7 lb

## 2020-10-21 DIAGNOSIS — K2970 Gastritis, unspecified, without bleeding: Secondary | ICD-10-CM | POA: Insufficient documentation

## 2020-10-21 DIAGNOSIS — Z3480 Encounter for supervision of other normal pregnancy, unspecified trimester: Secondary | ICD-10-CM

## 2020-10-21 MED ORDER — PROMETHAZINE HCL 12.5 MG PO TABS: 12.5000 mg | ORAL_TABLET | Freq: Four times a day (QID) | ORAL | 1 refills | Status: DC | PRN

## 2020-10-21 MED ORDER — FAMOTIDINE 20 MG PO TABS: 20.0000 mg | ORAL_TABLET | Freq: Two times a day (BID) | ORAL | 9 refills | Status: DC

## 2020-10-21 NOTE — Patient Instructions (Signed)
 Eleccin del mtodo anticonceptivo Contraception Choices La anticoncepcin, o los mtodos anticonceptivos, hace referencia a los mtodos o dispositivos que evitan el embarazo. Mtodos hormonales Implante anticonceptivo Un implante anticonceptivo consiste en un tubo delgado de plstico que contiene una hormona que evita el embarazo. Es diferente de un dispositivo intrauterino (DIU). Un mdico lo inserta en la parte superior del brazo. Los implantes pueden ser eficaces durante un mximo de 3 aos. Inyecciones de progestina sola Las inyecciones de progestina sola contienen progestina, una forma sinttica de la hormona progesterona. Un mdico las administra cada 3 meses. Pldoras anticonceptivas Las pldoras anticonceptivas son pastillas que contienen hormonas que evitan el embarazo. Deben tomarse una vez al da, preferentemente a la misma hora cada da. Se necesita una receta para utilizar este mtodo anticonceptivo. Parche anticonceptivo El parche anticonceptivo contiene hormonas que evitan el embarazo. Se coloca en la piel, debe cambiarse una vez a la semana durante tres semanas y debe retirarse en la cuarta semana. Se necesita una receta para utilizar este mtodo anticonceptivo. Anillo vaginal Un anillo vaginal contiene hormonas que evitan el embarazo. Se coloca en la vagina durante tres semanas y se retira en la cuarta semana. Luego se repite el proceso con un anillo nuevo. Se necesita una receta para utilizar este mtodo anticonceptivo. Anticonceptivo de emergencia Los anticonceptivos de emergencia son mtodos para evitar un embarazo despus de tener sexo sin proteccin. Vienen en forma de pldora y pueden tomarse hasta 5 das despus de tener sexo. Funcionan mejor cuando se toman lo ms pronto posible luego de tener sexo. La mayora de los anticonceptivos de emergencia estn disponibles sin receta mdica. Este mtodo no debe utilizarse como el nico mtodo anticonceptivo.   Mtodos de  barrera Condn masculino Un condn masculino es una vaina delgada que se coloca sobre el pene durante el sexo. Los condones evitan que el esperma ingrese en el cuerpo de la mujer. Pueden utilizarse con un una sustancia que mata a los espermatozoides (espermicida) para aumentar la efectividad. Deben desecharse despus de un uso. Condn femenino Un condn femenino es una vaina blanda y holgada que se coloca en la vagina antes de tener sexo. El condn evita que el esperma ingrese en el cuerpo de la mujer. Deben desecharse despus de un uso. Diafragma Un diafragma es una barrera blanda con forma de cpula. Se inserta en la vagina antes del sexo, junto con un espermicida. El diafragma bloquea el ingreso de esperma en el tero, y el espermicida mata a los espermatozoides. El diafragma debe permanecer en la vagina durante 6 a 8 horas despus de tener sexo y debe retirarse en el plazo de las 24 horas. Un diafragma es recetado y colocado por un mdico. Debe reemplazarse cada 1 a 2 aos, despus de dar a luz, de aumentar ms de 15lb (6.8kg) y de una ciruga plvica. Capuchn cervical Un capuchn cervical es una copa redonda y blanda de ltex o plstico que se coloca en el cuello uterino. Se inserta en la vagina antes del sexo, junto con un espermicida. Bloquea el ingreso del esperma en el tero. El capuchn debe permanecer en el lugar durante 6 a 8 horas despus de tener sexo y debe retirarse en el plazo de las 48 horas. Un capuchn cervical debe ser recetado y colocado por un mdico. Debe reemplazarse cada 2aos. Esponja Una esponja es una pieza blanda y circular de espuma de poliuretano que contiene espermicida. La esponja ayuda a bloquear el ingreso de esperma en el tero, y el   espermicida mata a los espermatozoides. Para utilizarla, debe humedecerla e insertarla en la vagina. Debe insertarse antes de tener sexo, debe permanecer dentro al menos durante 6 horas despus de tener sexo y debe retirarse y  desecharse en el plazo de las 30 horas. Espermicidas Los espermicidas son sustancias qumicas que matan o bloquean al esperma y no lo dejan ingresar al cuello uterino y al tero. Vienen en forma de crema, gel, supositorio, espuma o comprimido. Un espermicida debe insertarse en la vagina con un aplicador al menos 10 o 15 minutos antes de tener sexo para dar tiempo a que surta efecto. El proceso debe repetirse cada vez que tenga sexo. Los espermicidas no requieren receta mdica.   Anticonceptivos intrauterinos Dispositivo intrauterino (DIU) Un DIU es un dispositivo en forma de T que se coloca en el tero. Existen dos tipos:  DIU hormonal.Este tipo contiene progestina, una forma sinttica de la hormona progesterona. Este tipo puede permanecer colocado durante 3 a 5 aos.  DIU de cobre.Este tipo est recubierto con un alambre de cobre. Puede permanecer colocado durante 10 aos. Mtodos anticonceptivos permanentes Ligadura de trompas en la mujer En este mtodo, se sellan, atan u obstruyen las trompas de Falopio durante una ciruga para evitar que el vulo descienda hacia el tero. Esterilizacin histeroscpica En este mtodo, se coloca un implante pequeo y flexible dentro de cada trompa de Falopio. Los implantes hacen que se forme un tejido cicatricial en las trompas de Falopio y que las obstruya para que el espermatozoide no pueda llegar al vulo. El procedimiento demora alrededor de 3 meses para que sea efectivo. Debe utilizarse otro mtodo anticonceptivo durante esos 3 meses. Esterilizacin masculina Este es un procedimiento que consiste en atar los conductos que transportan el esperma (vasectoma). Luego del procedimiento, el hombre puede eyacular lquido (semen). Debe utilizarse otro mtodo anticonceptivo durante 3 meses despus del procedimiento. Mtodos de planificacin natural Planificacin familiar natural En este mtodo, la pareja no tiene sexo durante los das en que la mujer podra quedar  embarazada. Mtodo calendario En este mtodo, la mujer realiza un seguimiento de la duracin de cada ciclo menstrual, identifica los das en los que se puede producir un embarazo y no tiene sexo durante esos das. Mtodo de la ovulacin En este mtodo, la pareja evita tener sexo durante la ovulacin. Mtodo sintotrmico Este mtodo implica no tener sexo durante la ovulacin. Normalmente, la mujer comprueba la ovulacin al observar cambios en su temperatura y en la consistencia del moco cervical. Mtodo posovulacin En este mtodo, la pareja espera a que finalice la ovulacin para tener sexo. Dnde buscar ms informacin  Centers for Disease Control and Prevention (Centros para el Control y la Prevencin de Enfermedades): www.cdc.gov Resumen  La anticoncepcin, o los mtodos anticonceptivos, hace referencia a los mtodos o dispositivos que evitan el embarazo.  Los mtodos anticonceptivos hormonales incluyen implantes, inyecciones, pastillas, parches, anillos vaginales y anticonceptivos de emergencia.  Los mtodos anticonceptivos de barrera pueden incluir condones masculinos, condones femeninos, diafragmas, capuchones cervicales, esponjas y espermicidas.  Existen dos tipos de DIU (dispositivo intrauterino). Un DIU puede colocarse en el tero de una mujer para evitar el embarazo durante 3 a 5 aos.  La esterilizacin permanente puede realizarse mediante un procedimiento tanto en los hombres como en las mujeres. Los mtodos de planificacin familiar natural implican no tener sexo durante los das en que la mujer podra quedar embarazada. Esta informacin no tiene como fin reemplazar el consejo del mdico. Asegrese de hacerle al mdico cualquier pregunta que   tenga. Document Revised: 03/29/2020 Document Reviewed: 03/29/2020 Elsevier Patient Education  2021 [employer].   Lactancia materna Breastfeeding  Decidir amamantar es una de las mejores elecciones que puede hacer por usted y su  beb. Un cambio en las hormonas durante el embarazo hace que las mamas produzcan leche materna en las glndulas productoras de leche. Las hormonas impiden que la leche materna sea liberada antes del nacimiento del beb. Adems, impulsan el flujo de leche luego del nacimiento. Una vez que ha comenzado a amamantar, pensar en el beb, as como la succin o el llanto, pueden estimular la liberacin de leche de las glndulas productoras de leche. Los beneficios de amamantar Las investigaciones demuestran que la lactancia materna ofrece muchos beneficios de salud para bebs y madres. Adems, ofrece una forma gratuita y conveniente de alimentar al beb. Para el beb  La primera leche (calostro) ayuda a mejorar el funcionamiento del aparato digestivo del beb.  Las clulas especiales de la leche (anticuerpos) ayudan a combatir las infecciones en el beb.  Los bebs que se alimentan con leche materna tambin tienen menos probabilidades de tener asma, alergias, obesidad o diabetes de tipo 2. Adems, tienen menor riesgo de sufrir el sndrome de muerte sbita del lactante (SMSL).  Los nutrientes de la leche materna son mejores para satisfacer las necesidades del beb en comparacin con la leche maternizada.  La leche materna mejora el desarrollo cerebral del beb. Para usted  La lactancia materna favorece el desarrollo de un vnculo muy especial entre la madre y el beb.  Es conveniente. La leche materna es econmica y siempre est disponible a la temperatura correcta.  La lactancia materna ayuda a quemar caloras. Le ayuda a perder el peso ganado durante el embarazo.  Hace que el tero vuelva al tamao que tena antes del embarazo ms rpido. Adems, disminuye el sangrado (loquios) despus del parto.  La lactancia materna contribuye a reducir el riesgo de tener diabetes de tipo 2, osteoporosis, artritis reumatoide, enfermedades cardiovasculares y cncer de mama, ovario, tero y endometrio en el  futuro. Informacin bsica sobre la lactancia Comienzo de la lactancia  Encuentre un lugar cmodo para sentarse o acostarse, con un buen respaldo para el cuello y la espalda.  Coloque una almohada o una manta enrollada debajo del beb para acomodarlo a la altura de la mama (si est sentada). Las almohadas para amamantar se han diseado especialmente a fin de servir de apoyo para los brazos y el beb mientras amamanta.  Asegrese de que la barriga del beb (abdomen) est frente a la suya.  Masajee suavemente la mama. Con las yemas de los dedos, masajee los bordes exteriores de la mama hacia adentro, en direccin al pezn. Esto estimula el flujo de leche. Si la leche fluye lentamente, es posible que deba continuar con este movimiento durante la lactancia.  Sostenga la mama con 4 dedos por debajo y el pulgar por arriba del pezn (forme la letra "C" con la mano). Asegrese de que los dedos se encuentren lejos del pezn y de la boca del beb.  Empuje suavemente los labios del beb con el pezn o con el dedo.  Cuando la boca del beb se abra lo suficiente, acrquelo rpidamente a la mama e introduzca todo el pezn y la arola, tanto como sea posible, dentro de la boca del beb. La arola es la zona de color que rodea al pezn. ? Debe haber ms arola visible por arriba del labio superior del beb que por debajo del labio   inferior. ? Los labios del beb deben estar abiertos y extendidos hacia afuera (evertidos) para asegurar que el beb se prenda de forma adecuada y cmoda. ? La lengua del beb debe estar entre la enca inferior y la mama.  Asegrese de que la boca del beb est en la posicin correcta alrededor del pezn (prendido). Los labios del beb deben crear un sello sobre la mama y estar doblados hacia afuera (invertidos).  Es comn que el beb succione durante 2 a 3 minutos para que comience el flujo de leche materna. Cmo debe prenderse Es muy importante que le ensee al beb cmo  prenderse adecuadamente a la mama. Si el beb no se prende adecuadamente, puede causar dolor en los pezones, reducir la produccin de leche materna y hacer que el beb tenga un escaso aumento de peso. Adems, si el beb no se prende adecuadamente al pezn, puede tragar aire durante la alimentacin. Esto puede causarle molestias al beb. Hacer eructar al beb al cambiar de mama puede ayudarlo a liberar el aire. Sin embargo, ensearle al beb cmo prenderse a la mama adecuadamente es la mejor manera de evitar que se sienta molesto por tragar aire mientras se alimenta. Signos de que el beb se ha prendido adecuadamente al pezn  Tironea o succiona de modo silencioso, sin causarle dolor. Los labios del beb deben estar extendidos hacia afuera (evertidos).  Se escucha que traga cada 3 o 4 succiones una vez que la leche ha comenzado a fluir (despus de que se produzca el reflejo de eyeccin de la leche).  Hay movimientos musculares por arriba y por delante de sus odos al succionar. Signos de que el beb no se ha prendido adecuadamente al pezn  Hace ruidos de succin o de chasquido mientras se alimenta.  Siente dolor en los pezones. Si cree que el beb no se prendi correctamente, deslice el dedo en la comisura de la boca y colquelo entre las encas del beb para interrumpir la succin. Intente volver a comenzar a amamantar. Signos de lactancia materna exitosa Signos del beb  El beb disminuir gradualmente el nmero de succiones o dejar de succionar por completo.  El beb se quedar dormido.  El cuerpo del beb se relajar.  El beb retendr una pequea cantidad de leche en la boca.  El beb se desprender solo del pecho. Signos que presenta usted  Las mamas han aumentado la firmeza, el peso y el tamao 1 a 3 horas despus de amamantar.  Estn ms blandas inmediatamente despus de amamantar.  Se producen un aumento del volumen de leche y un cambio en su consistencia y color hacia el  quinto da de lactancia.  Los pezones no duelen, no estn agrietados ni sangran. Signos de que su beb recibe la cantidad de leche suficiente  Mojar por lo menos 1 o 2paales durante las primeras 24horas despus del nacimiento.  Mojar por lo menos 5 o 6paales cada 24horas durante la primera semana despus del nacimiento. La orina debe ser clara o de color amarillo plido a los 5das de vida.  Mojar entre 6 y 8paales cada 24horas a medida que el beb sigue creciendo y desarrollndose.  Defeca por lo menos 3 veces en 24 horas a los 5 das de vida. Las heces deben ser blandas y amarillentas.  Defeca por lo menos 3 veces en 24 horas a los 7 das de vida. Las heces deben ser grumosas y amarillentas.  No registra una prdida de peso mayor al 10% del peso al   nacer durante los primeros 3 das de vida.  Aumenta de peso un promedio de 4 a 7onzas (113 a 198g) por semana despus de los 4 das de vida.  Aumenta de peso, diariamente, de manera uniforme a partir de los 5 das de vida, sin registrar prdida de peso despus de las 2semanas de vida. Despus de alimentarse, es posible que el beb regurgite una pequea cantidad de leche. Esto es normal. Frecuencia y duracin de la lactancia El amamantamiento frecuente la ayudar a producir ms leche y puede prevenir dolores en los pezones y las mamas extremadamente llenas (congestin mamaria). Alimente al beb cuando muestre signos de hambre o si siente la necesidad de reducir la congestin de las mamas. Esto se denomina "lactancia a demanda". Las seales de que el beb tiene hambre incluyen las siguientes:  Aumento del estado de alerta, actividad o inquietud.  Mueve la cabeza de un lado a otro.  Abre la boca cuando se le toca la mejilla o la comisura de la boca (reflejo de bsqueda).  Aumenta las vocalizaciones, tales como sonidos de succin, se relame los labios, emite arrullos, suspiros o chirridos.  Mueve la mano hacia la boca y se chupa  los dedos o las manos.  Est molesto o llora. Evite el uso del chupete en las primeras 4 a 6 semanas despus del nacimiento del beb. Despus de este perodo, podr usar un chupete. Las investigaciones demostraron que el uso del chupete durante el primer ao de vida del beb disminuye el riesgo de tener el sndrome de muerte sbita del lactante (SMSL). Permita que el nio se alimente en cada mama todo lo que desee. Cuando el beb se desprende o se queda dormido mientras se est alimentando de la primera mama, ofrzcale la segunda. Debido a que, con frecuencia, los recin nacidos estn somnolientos las primeras semanas de vida, es posible que deba despertar al beb para alimentarlo. Los horarios de lactancia varan de un beb a otro. Sin embargo, las siguientes reglas pueden servir como gua para ayudarla a garantizar que el beb se alimenta adecuadamente:  Se puede amamantar a los recin nacidos (bebs de 4 semanas o menos de vida) cada 1 a 3 horas.  No deben transcurrir ms de 3 horas durante el da o 5 horas durante la noche sin que se amamante a los recin nacidos.  Debe amamantar al beb un mnimo de 8 veces en un perodo de 24 horas. Extraccin de leche materna La extraccin y el almacenamiento de la leche materna le permiten asegurarse de que el beb se alimente exclusivamente de su leche materna, aun en momentos en los que no puede amamantar. Esto tiene especial importancia si debe regresar al trabajo en el perodo en que an est amamantando o si no puede estar presente en los momentos en que el beb debe alimentarse. Su asesor en lactancia puede ayudarla a encontrar un mtodo de extraccin que funcione mejor para usted y orientarla sobre cunto tiempo es seguro almacenar leche materna.      Cmo cuidar las mamas durante la lactancia Los pezones pueden secarse, agrietarse y doler durante la lactancia. Las siguientes recomendaciones pueden ayudarla a mantener las mamas humectadas y  sanas:  Evite usar jabn en los pezones.  Use un sostn de soporte diseado especialmente para la lactancia materna. Evite usar sostenes con aro o sostenes muy ajustados (sostenes deportivos).  Seque al aire sus pezones durante 3 a 4minutos despus de amamantar al beb.  Utilice solo apsitos de algodn en   el sostn para absorber las prdidas de leche. La prdida de un poco de leche materna entre las tomas es normal.  Utilice lanolina sobre los pezones luego de amamantar. La lanolina ayuda a mantener la humedad normal de la piel. La lanolina pura no es perjudicial (no es txica) para el beb. Adems, puede extraer manualmente algunas gotas de leche materna y masajear suavemente esa leche sobre los pezones para que la leche se seque al aire. Durante las primeras semanas despus del nacimiento, algunas mujeres experimentan congestin mamaria. La congestin mamaria puede hacer que sienta las mamas pesadas, calientes y sensibles al tacto. El pico de la congestin mamaria ocurre en el plazo de los 3 a 5 das despus del parto. Las siguientes recomendaciones pueden ayudarla a aliviar la congestin mamaria:  Vace por completo las mamas al amamantar o extraer leche. Puede aplicar calor hmedo en las mamas (en la ducha o con toallas hmedas para manos) antes de amamantar o extraer leche. Esto aumenta la circulacin y ayuda a que la leche fluya. Si el beb no vaca por completo las mamas cuando lo amamanta, extraiga la leche restante despus de que haya finalizado.  Aplique compresas de hielo sobre las mamas inmediatamente despus de amamantar o extraer leche, a menos que le resulte demasiado incmodo. Haga lo siguiente: ? Ponga el hielo en una bolsa plstica. ? Coloque una toalla entre la piel y la bolsa de hielo. ? Coloque el hielo durante 20minutos, 2 o 3veces por da.  Asegrese de que el beb est prendido y se encuentre en la posicin correcta mientras lo alimenta. Si la congestin mamaria  persiste luego de 48 horas o despus de seguir estas recomendaciones, comunquese con su mdico o un asesor en lactancia. Recomendaciones de salud general durante la lactancia  Consuma 3 comidas y 3 colaciones saludables todos los das. Las madres bien alimentadas que amamantan necesitan entre 450 y 500 caloras adicionales por da. Puede cumplir con este requisito al aumentar la cantidad de una dieta equilibrada que realice.  Beba suficiente agua para mantener la orina clara o de color amarillo plido.  Descanse con frecuencia, reljese y siga tomando sus vitaminas prenatales para prevenir la fatiga, el estrs y los niveles bajos de vitaminas y minerales en el cuerpo (deficiencias de nutrientes).  No consuma ningn producto que contenga nicotina o tabaco, como cigarrillos y cigarrillos electrnicos. El beb puede verse afectado por las sustancias qumicas de los cigarrillos que pasan a la leche materna y por la exposicin al humo ambiental del tabaco. Si necesita ayuda para dejar de fumar, consulte al mdico.  Evite el consumo de alcohol.  No consuma drogas ilegales o marihuana.  Antes de usar cualquier medicamento, hable con el mdico. Estos incluyen medicamentos recetados y de venta libre, como tambin vitaminas y suplementos a base de hierbas. Algunos medicamentos, que pueden ser perjudiciales para el beb, pueden pasar a travs de la leche materna.  Puede quedar embarazada durante la lactancia. Si se desea un mtodo anticonceptivo, consulte al mdico sobre cules son las opciones seguras durante la lactancia. Dnde encontrar ms informacin: Liga internacional La Leche: www.llli.org. Comunquese con un mdico si:  Siente que quiere dejar de amamantar o se siente frustrada con la lactancia.  Sus pezones estn agrietados o sangran.  Sus mamas estn irritadas, sensibles o calientes.  Tiene los siguientes sntomas: ? Dolor en las mamas o en los pezones. ? Un rea hinchada en cualquiera  de las mamas. ? Fiebre o escalofros. ? Nuseas o vmitos. ?   Drenaje de otro lquido distinto de la leche materna desde los pezones.  Sus mamas no se llenan antes de amamantar al beb para el quinto da despus del parto.  Se siente triste y deprimida.  El beb: ? Est demasiado somnoliento como para comer bien. ? Tiene problemas para dormir. ? Tiene ms de 1 semana de vida y moja menos de 6 paales en un periodo de 24 horas. ? No ha aumentado de peso a los 5 das de vida.  El beb defeca menos de 3 veces en 24 horas.  La piel del beb o las partes blancas de los ojos se vuelven amarillentas. Solicite ayuda de inmediato si:  El beb est muy cansado (letargo) y no se quiere despertar para comer.  Le sube la fiebre sin causa. Resumen  La lactancia materna ofrece muchos beneficios de salud para bebs y madres.  Intente amamantar a su beb cuando muestre signos tempranos de hambre.  Haga cosquillas o empuje suavemente los labios del beb con el dedo o el pezn para lograr que el beb abra la boca. Acerque el beb a la mama. Asegrese de que la mayor parte de la arola se encuentre dentro de la boca del beb. Ofrzcale una mama y haga eructar al beb antes de pasar a la otra.  Hable con su mdico o asesor en lactancia si tiene dudas o problemas con la lactancia. Esta informacin no tiene como fin reemplazar el consejo del mdico. Asegrese de hacerle al mdico cualquier pregunta que tenga. Document Revised: 11/21/2017 Document Reviewed: 12/17/2016 Elsevier Patient Education  2021 [employer] Inc.  

## 2020-10-21 NOTE — Progress Notes (Signed)
   Subjective:  Belinda May is a 20 y.o. G1P0000 at [redacted]w[redacted]d being seen today for ongoing prenatal care.  She is currently monitored for the following issues for this low-risk pregnancy and has PCOS (polycystic ovarian syndrome); Supervision of other normal pregnancy, antepartum; and History of appendectomy on their problem list.  Patient reports improving nausea.  Contractions: Not present. Vag. Bleeding: None.  Movement: Present. Denies leaking of fluid.   The following portions of the patient's history were reviewed and updated as appropriate: allergies, current medications, past family history, past medical history, past social history, past surgical history and problem list. Problem list updated.  Objective:   Vitals:   10/21/20 0908  BP: 128/72  Pulse: 91  Weight: 185 lb 11.2 oz (84.2 kg)    Fetal Status: Fetal Heart Rate (bpm): 152   Movement: Present     General:  Alert, oriented and cooperative. Patient is in no acute distress.  Skin: Skin is warm and dry. No rash noted.   Cardiovascular: Normal heart rate noted  Respiratory: Normal respiratory effort, no problems with respiration noted  Abdomen: Soft, gravid, appropriate for gestational age. Pain/Pressure: Absent     Pelvic: Vag. Bleeding: None     Cervical exam deferred        Extremities: Normal range of motion.  Edema: None  Mental Status: Normal mood and affect. Normal behavior. Normal judgment and thought content.   Urinalysis:      Assessment and Plan:  Pregnancy: G1P0000 at [redacted]w[redacted]d  1. Supervision of other normal pregnancy, antepartum BP and FHR normal Refill sent for phenergan Discussed contraception, will review bedsiders and consider  2. Gastritis Reports some burning epigastric sensation, minimal GERD symptoms High suspicion for H.pylori given she is from [place] originally Trial pepcid, if no improvement can trial PPI Should be tested for H.pylori postpartum  Preterm labor symptoms and  general obstetric precautions including but not limited to vaginal bleeding, contractions, leaking of fluid and fetal movement were reviewed in detail with the patient. Please refer to After Visit Summary for other counseling recommendations.  Return in 4 weeks (on 11/18/2020) for [facility], ob visit.   Venora Maples, MD

## 2020-10-23 LAB — AFP, SERUM, OPEN SPINA BIFIDA
AFP MoM: 1.09
AFP Value: 44.6 ng/mL
Gest. Age on Collection Date: 18.5 weeks
Maternal Age At EDD: 20.8 yr
OSBR Risk 1 IN: 9231
Test Results:: NEGATIVE
Weight: 185 [lb_av]

## 2020-10-28 ENCOUNTER — Other Ambulatory Visit: Payer: Self-pay | Admitting: *Deleted

## 2020-10-28 ENCOUNTER — Other Ambulatory Visit: Payer: Self-pay | Admitting: Family Medicine

## 2020-10-28 ENCOUNTER — Ambulatory Visit: Payer: Self-pay | Attending: Family Medicine

## 2020-10-28 ENCOUNTER — Other Ambulatory Visit: Payer: Self-pay

## 2020-10-28 DIAGNOSIS — Z362 Encounter for other antenatal screening follow-up: Secondary | ICD-10-CM

## 2020-10-28 DIAGNOSIS — Z3480 Encounter for supervision of other normal pregnancy, unspecified trimester: Secondary | ICD-10-CM | POA: Insufficient documentation

## 2020-11-11 ENCOUNTER — Encounter: Payer: Self-pay | Admitting: *Deleted

## 2020-11-18 ENCOUNTER — Encounter: Payer: Self-pay | Admitting: Family Medicine

## 2020-11-18 ENCOUNTER — Ambulatory Visit (INDEPENDENT_AMBULATORY_CARE_PROVIDER_SITE_OTHER): Payer: Self-pay | Admitting: Family Medicine

## 2020-11-18 ENCOUNTER — Other Ambulatory Visit: Payer: Self-pay

## 2020-11-18 VITALS — BP 115/63 | HR 87 | Wt 192.0 lb

## 2020-11-18 DIAGNOSIS — K2970 Gastritis, unspecified, without bleeding: Secondary | ICD-10-CM

## 2020-11-18 DIAGNOSIS — Z3480 Encounter for supervision of other normal pregnancy, unspecified trimester: Secondary | ICD-10-CM

## 2020-11-18 NOTE — Patient Instructions (Signed)
 Segundo trimestre de embarazo Second Trimester of Pregnancy  El segundo trimestre de embarazo va desde la semana 13 hasta la semana 27. Es decir desde el mes 4 hasta el mes 6 de embarazo. El segundo trimestre suele ser el momento en el que mejor se siente. Su organismo se ha adaptado a estar embarazada, y comienza a sentirse fsicamente mejor. Durante el segundo trimestre:  Las nuseas del embarazo han disminuido o han desaparecido completamente.  Usted puede tener ms energa.  Es posible que tenga un aumento del apetito. El segundo trimestre es tambin un perodo en el que el beb en gestacin (feto) crece rpidamente. Hacia el final del sexto mes, el feto puede medir aproximadamente 12 pulgadas y pesar alrededor de 1 libras. Es probable que sienta que el beb se mueve (da pataditas) entre las 16 y 20semanas del embarazo. Cambios en el cuerpo durante el segundo trimestre Su cuerpo continua experimentando numerosos cambios durante su segundo trimestre. Los cambios varan y generalmente vuelven a la normalidad despus del nacimiento del beb. Cambios fsicos  Seguir aumentando de peso. Notar que la parte baja del abdomen sobresale.  Podrn aparecer las primeras estras en las caderas, el abdomen y las mamas.  Las mamas seguirn creciendo y se tornarn sensibles.  Pueden aparecer zonas oscuras o manchas (cloasma o mscara del embarazo) en el rostro.  Es posible que se forme una lnea oscura desde el ombligo hasta la zona del pubis (linea nigra).  Tal vez haya cambios en el cabello. Esto cambios pueden incluir su engrosamiento, crecimiento rpido y cambios en la textura. A algunas personas tambin se les cae el cabello durante o despus del embarazo, o tienen el cabello seco o fino. Cambios en la salud  Comienza a tener dolores de cabeza.  Es posible que tenga acidez estomacal.  Puede tener estreimiento.  Pueden aparecer hemorroides o abultarse e hincharse las venas (venas  varicosas).  Las encas pueden sangrar y estar sensibles al cepillado y al hilo dental.  Tal vez tenga necesidad de orinar con ms frecuencia porque el feto est ejerciendo presin sobre la vejiga.  Puede sentir dolor en la espalda. Esto se debe a: ? Aumento de peso. ? Las hormonas del embarazo relajan las articulaciones en la pelvis. ? Un cambio en el peso y los msculos que ayudan a mantener su equilibrio. Siga estas instrucciones en su casa: Medicamentos  Siga las instrucciones del mdico en relacin con el uso de medicamentos. Durante el embarazo, hay medicamentos que pueden tomarse y otros que no. No tome medicamentos a menos que lo haya autorizado el mdico.  Tome vitaminas prenatales que contengan por lo menos 600microgramos (mcg) de cido flico. Comida y bebida  Lleve una dieta saludable que incluya frutas y verduras frescas, cereales integrales, buenas fuentes de protenas como carnes magras, huevos o tofu, y productos lcteos descremados.  Evite la carne cruda y el jugo, la leche y el queso sin pasteurizar. Estos portan grmenes que pueden provocar dao tanto a usted como al beb.  Es posible que tenga que tomar estas medidas para prevenir o tratar el estreimiento: ? Beber suficiente lquido como para mantener la orina de color amarillo plido. ? Consumir alimentos ricos en fibra, como frijoles, cereales integrales, y frutas y verduras frescas. ? Limitar el consumo de alimentos ricos en grasa y azcares procesados, como los alimentos fritos o dulces. Actividad  Haga ejercicio solamente como se lo haya indicado el mdico. La mayora de las personas pueden continuar su rutina de   ejercicios durante el embarazo. Intente realizar como mnimo 30minutos de actividad fsica por lo menos 5das a la semana. Deje de hacer ejercicio si comienza a tener contracciones en el tero.  Deje de hacer ejercicio si le aparecen dolor o clicos en la parte baja del vientre o de la  espalda.  Evite hacer ejercicio si hace mucho calor o humedad, o si se encuentra a una altitud elevada.  Evite levantar pesos excesivos.  Si lo desea, puede seguir teniendo relaciones sexuales, salvo que el mdico le indique lo contrario. Alivio del dolor y del malestar  Use un sujetador que le brinde buen soporte para prevenir las molestias causadas por la sensibilidad en las mamas.  Dese baos de asiento con agua tibia para aliviar el dolor o las molestias causadas por las hemorroides. Use una crema para las hemorroides si el mdico la autoriza.  Descanse con las piernas levantadas (elevadas) si tiene calambres en las piernas o dolor en la parte baja de la espalda.  Si tiene venas varicosas: ? Use medias de compresin como se lo haya indicado el mdico. ? Eleve los pies durante 15minutos, 3 o 4veces por da. ? Limite el consumo de sal en su dieta. Seguridad  Use el cinturn de seguridad en todo momento mientras conduce o va en auto.  Hable con el mdico si es vctima de maltrato verbal o fsico. Estilo de vida  No se d baos de inmersin en agua caliente, baos turcos ni saunas.  No se haga duchas vaginales. No use tampones ni toallas higinicas perfumadas.  Evite el contacto con las bandejas sanitarias de los gatos y la tierra que estos animales usan. Estos elementos contienen bacterias que pueden causar defectos congnitos al beb y la posible prdida del feto debido a un aborto espontneo o muerte fetal.  No use remedios a base de hierbas, alcohol, drogas ilegales ni medicamentos que no estn aprobados por el mdico. Las sustancias qumicas de estos productos pueden daar al beb.  No consuma ningn producto que contenga nicotina o tabaco, como cigarrillos, cigarrillos electrnicos y tabaco de mascar. Si necesita ayuda para dejar de fumar, consulte al mdico. Instrucciones generales  Durante una visita prenatal de rutina, el mdico le har un examen fsico y otras  pruebas. Tambin le hablar sobre su salud general. Cumpla con todas las visitas de seguimiento. Esto es importante.  Pdale al mdico que la derive a clases de educacin prenatal en su localidad.  Pida ayuda si tiene necesidades nutricionales o de asesoramiento durante el embarazo. El mdico puede aconsejarla o derivarla a especialistas para que la ayuden con diferentes necesidades. Dnde buscar ms informacin  American Pregnancy Association (Asociacin Americana del Embarazo): americanpregnancy.org  American College of Obstetricians and Gynecologists (Colegio Estadounidense de Obstetras y Gineclogos): [email or web address]/[email or web address]?  Office on Women's Health (Oficina para la Salud de la Mujer): [email or web address].gov/pregnancy Comunquese con un mdico si tiene:  Un dolor de cabeza que no desaparece despus de tomar analgsicos.  Cambios en la visin o ve manchas delante de los ojos.  Clicos leves, presin en la pelvis o dolor persistente en el abdomen.  Nuseas persistentes, vmitos o diarrea.  Secrecin vaginal con mal olor u orina con olor ftido.  Dolor al orinar.  Hinchazn sbita o extrema del rostro, las manos, los tobillos, los pies o las piernas.  Fiebre. Busque ayuda de inmediato si:  Tiene una prdida de lquido por la vagina.  Tiene sangrado ligero o manchas vaginales.  Tiene dolor intenso o clicos en el abdomen.    Presenta dificultad para respirar.  Siente dolor en el pecho.  Tiene episodios de desmayo.  No ha sentido a su beb moverse durante el perodo de tiempo que le indic el mdico.  Tiene dolor, hinchazn o enrojecimiento nuevos o ms intensos en un brazo o una pierna. Resumen  El segundo trimestre de embarazo va desde la semana 13 hasta la 27 (desde el mes 4 hasta el 6).  No use remedios a base de hierbas, alcohol, drogas ilegales ni medicamentos que no estn aprobados por el mdico. Las sustancias qumicas de estos productos pueden daar al  beb.  Haga ejercicio solamente como se lo haya indicado el mdico. La mayora de las personas pueden continuar su rutina de ejercicios durante el embarazo.  Cumpla con todas las visitas de seguimiento. Esto es importante. Esta informacin no tiene como fin reemplazar el consejo del mdico. Asegrese de hacerle al mdico cualquier pregunta que tenga. Document Revised: 03/07/2020 Document Reviewed: 03/07/2020 Elsevier Patient Education  2021 Elsevier Inc.   Eleccin del mtodo anticonceptivo Contraception Choices La anticoncepcin, o los mtodos anticonceptivos, hace referencia a los mtodos o dispositivos que evitan el embarazo. Mtodos hormonales Implante anticonceptivo Un implante anticonceptivo consiste en un tubo delgado de plstico que contiene una hormona que evita el embarazo. Es diferente de un dispositivo intrauterino (DIU). Un mdico lo inserta en la parte superior del brazo. Los implantes pueden ser eficaces durante un mximo de 3 aos. Inyecciones de progestina sola Las inyecciones de progestina sola contienen progestina, una forma sinttica de la hormona progesterona. Un mdico las administra cada 3 meses. Pldoras anticonceptivas Las pldoras anticonceptivas son pastillas que contienen hormonas que evitan el embarazo. Deben tomarse una vez al da, preferentemente a la misma hora cada da. Se necesita una receta para utilizar este mtodo anticonceptivo. Parche anticonceptivo El parche anticonceptivo contiene hormonas que evitan el embarazo. Se coloca en la piel, debe cambiarse una vez a la semana durante tres semanas y debe retirarse en la cuarta semana. Se necesita una receta para utilizar este mtodo anticonceptivo. Anillo vaginal Un anillo vaginal contiene hormonas que evitan el embarazo. Se coloca en la vagina durante tres semanas y se retira en la cuarta semana. Luego se repite el proceso con un anillo nuevo. Se necesita una receta para utilizar este mtodo  anticonceptivo. Anticonceptivo de emergencia Los anticonceptivos de emergencia son mtodos para evitar un embarazo despus de tener sexo sin proteccin. Vienen en forma de pldora y pueden tomarse hasta 5 das despus de tener sexo. Funcionan mejor cuando se toman lo ms pronto posible luego de tener sexo. La mayora de los anticonceptivos de emergencia estn disponibles sin receta mdica. Este mtodo no debe utilizarse como el nico mtodo anticonceptivo.   Mtodos de barrera Condn masculino Un condn masculino es una vaina delgada que se coloca sobre el pene durante el sexo. Los condones evitan que el esperma ingrese en el cuerpo de la mujer. Pueden utilizarse con un una sustancia que mata a los espermatozoides (espermicida) para aumentar la efectividad. Deben desecharse despus de un uso. Condn femenino Un condn femenino es una vaina blanda y holgada que se coloca en la vagina antes de tener sexo. El condn evita que el esperma ingrese en el cuerpo de la mujer. Deben desecharse despus de un uso. Diafragma Un diafragma es una barrera blanda con forma de cpula. Se inserta en la vagina antes del sexo, junto con un espermicida. El diafragma bloquea el ingreso de esperma en el tero, y el espermicida mata a los   espermatozoides. El diafragma debe permanecer en la vagina durante 6 a 8 horas despus de tener sexo y debe retirarse en el plazo de las 24 horas. Un diafragma es recetado y colocado por un mdico. Debe reemplazarse cada 1 a 2 aos, despus de dar a luz, de aumentar ms de 15lb (6.8kg) y de una ciruga plvica. Capuchn cervical Un capuchn cervical es una copa redonda y blanda de ltex o plstico que se coloca en el cuello uterino. Se inserta en la vagina antes del sexo, junto con un espermicida. Bloquea el ingreso del esperma en el tero. El capuchn debe permanecer en el lugar durante 6 a 8 horas despus de tener sexo y debe retirarse en el plazo de las 48 horas. Un capuchn cervical debe  ser recetado y colocado por un mdico. Debe reemplazarse cada 2aos. Esponja Una esponja es una pieza blanda y circular de espuma de poliuretano que contiene espermicida. La esponja ayuda a bloquear el ingreso de esperma en el tero, y el espermicida mata a los espermatozoides. Para utilizarla, debe humedecerla e insertarla en la vagina. Debe insertarse antes de tener sexo, debe permanecer dentro al menos durante 6 horas despus de tener sexo y debe retirarse y desecharse en el plazo de las 30 horas. Espermicidas Los espermicidas son sustancias qumicas que matan o bloquean al esperma y no lo dejan ingresar al cuello uterino y al tero. Vienen en forma de crema, gel, supositorio, espuma o comprimido. Un espermicida debe insertarse en la vagina con un aplicador al menos 10 o 15 minutos antes de tener sexo para dar tiempo a que surta efecto. El proceso debe repetirse cada vez que tenga sexo. Los espermicidas no requieren receta mdica.   Anticonceptivos intrauterinos Dispositivo intrauterino (DIU) Un DIU es un dispositivo en forma de T que se coloca en el tero. Existen dos tipos:  DIU hormonal.Este tipo contiene progestina, una forma sinttica de la hormona progesterona. Este tipo puede permanecer colocado durante 3 a 5 aos.  DIU de cobre.Este tipo est recubierto con un alambre de cobre. Puede permanecer colocado durante 10 aos. Mtodos anticonceptivos permanentes Ligadura de trompas en la mujer En este mtodo, se sellan, atan u obstruyen las trompas de Falopio durante una ciruga para evitar que el vulo descienda hacia el tero. Esterilizacin histeroscpica En este mtodo, se coloca un implante pequeo y flexible dentro de cada trompa de Falopio. Los implantes hacen que se forme un tejido cicatricial en las trompas de Falopio y que las obstruya para que el espermatozoide no pueda llegar al vulo. El procedimiento demora alrededor de 3 meses para que sea efectivo. Debe utilizarse otro mtodo  anticonceptivo durante esos 3 meses. Esterilizacin masculina Este es un procedimiento que consiste en atar los conductos que transportan el esperma (vasectoma). Luego del procedimiento, el hombre puede eyacular lquido (semen). Debe utilizarse otro mtodo anticonceptivo durante 3 meses despus del procedimiento. Mtodos de planificacin natural Planificacin familiar natural En este mtodo, la pareja no tiene sexo durante los das en que la mujer podra quedar embarazada. Mtodo calendario En este mtodo, la mujer realiza un seguimiento de la duracin de cada ciclo menstrual, identifica los das en los que se puede producir un embarazo y no tiene sexo durante esos das. Mtodo de la ovulacin En este mtodo, la pareja evita tener sexo durante la ovulacin. Mtodo sintotrmico Este mtodo implica no tener sexo durante la ovulacin. Normalmente, la mujer comprueba la ovulacin al observar cambios en su temperatura y en la consistencia del moco cervical. Mtodo   posovulacin En este mtodo, la pareja espera a que finalice la ovulacin para tener sexo. Dnde buscar ms informacin  Centers for Disease Control and Prevention (Centros para el Control y la Prevencin de Enfermedades): www.cdc.gov Resumen  La anticoncepcin, o los mtodos anticonceptivos, hace referencia a los mtodos o dispositivos que evitan el embarazo.  Los mtodos anticonceptivos hormonales incluyen implantes, inyecciones, pastillas, parches, anillos vaginales y anticonceptivos de emergencia.  Los mtodos anticonceptivos de barrera pueden incluir condones masculinos, condones femeninos, diafragmas, capuchones cervicales, esponjas y espermicidas.  Existen dos tipos de DIU (dispositivo intrauterino). Un DIU puede colocarse en el tero de una mujer para evitar el embarazo durante 3 a 5 aos.  La esterilizacin permanente puede realizarse mediante un procedimiento tanto en los hombres como en las mujeres. Los mtodos de  planificacin familiar natural implican no tener sexo durante los das en que la mujer podra quedar embarazada. Esta informacin no tiene como fin reemplazar el consejo del mdico. Asegrese de hacerle al mdico cualquier pregunta que tenga. Document Revised: 03/29/2020 Document Reviewed: 03/29/2020 Elsevier Patient Education  2021 Elsevier Inc.   Lactancia materna Breastfeeding  Decidir amamantar es una de las mejores elecciones que puede hacer por usted y su beb. Un cambio en las hormonas durante el embarazo hace que las mamas produzcan leche materna en las glndulas productoras de leche. Las hormonas impiden que la leche materna sea liberada antes del nacimiento del beb. Adems, impulsan el flujo de leche luego del nacimiento. Una vez que ha comenzado a amamantar, pensar en el beb, as como la succin o el llanto, pueden estimular la liberacin de leche de las glndulas productoras de leche. Los beneficios de amamantar Las investigaciones demuestran que la lactancia materna ofrece muchos beneficios de salud para bebs y madres. Adems, ofrece una forma gratuita y conveniente de alimentar al beb. Para el beb  La primera leche (calostro) ayuda a mejorar el funcionamiento del aparato digestivo del beb.  Las clulas especiales de la leche (anticuerpos) ayudan a combatir las infecciones en el beb.  Los bebs que se alimentan con leche materna tambin tienen menos probabilidades de tener asma, alergias, obesidad o diabetes de tipo 2. Adems, tienen menor riesgo de sufrir el sndrome de muerte sbita del lactante (SMSL).  Los nutrientes de la leche materna son mejores para satisfacer las necesidades del beb en comparacin con la leche maternizada.  La leche materna mejora el desarrollo cerebral del beb. Para usted  La lactancia materna favorece el desarrollo de un vnculo muy especial entre la madre y el beb.  Es conveniente. La leche materna es econmica y siempre est disponible  a la temperatura correcta.  La lactancia materna ayuda a quemar caloras. Le ayuda a perder el peso ganado durante el embarazo.  Hace que el tero vuelva al tamao que tena antes del embarazo ms rpido. Adems, disminuye el sangrado (loquios) despus del parto.  La lactancia materna contribuye a reducir el riesgo de tener diabetes de tipo 2, osteoporosis, artritis reumatoide, enfermedades cardiovasculares y cncer de mama, ovario, tero y endometrio en el futuro. Informacin bsica sobre la lactancia Comienzo de la lactancia  Encuentre un lugar cmodo para sentarse o acostarse, con un buen respaldo para el cuello y la espalda.  Coloque una almohada o una manta enrollada debajo del beb para acomodarlo a la altura de la mama (si est sentada). Las almohadas para amamantar se han diseado especialmente a fin de servir de apoyo para los brazos y el beb mientras amamanta.  Asegrese de   que la barriga del beb (abdomen) est frente a la suya.  Masajee suavemente la mama. Con las yemas de los dedos, masajee los bordes exteriores de la mama hacia adentro, en direccin al pezn. Esto estimula el flujo de leche. Si la leche fluye lentamente, es posible que deba continuar con este movimiento durante la lactancia.  Sostenga la mama con 4 dedos por debajo y el pulgar por arriba del pezn (forme la letra "C" con la mano). Asegrese de que los dedos se encuentren lejos del pezn y de la boca del beb.  Empuje suavemente los labios del beb con el pezn o con el dedo.  Cuando la boca del beb se abra lo suficiente, acrquelo rpidamente a la mama e introduzca todo el pezn y la arola, tanto como sea posible, dentro de la boca del beb. La arola es la zona de color que rodea al pezn. ? Debe haber ms arola visible por arriba del labio superior del beb que por debajo del labio inferior. ? Los labios del beb deben estar abiertos y extendidos hacia afuera (evertidos) para asegurar que el beb se prenda  de forma adecuada y cmoda. ? La lengua del beb debe estar entre la enca inferior y la mama.  Asegrese de que la boca del beb est en la posicin correcta alrededor del pezn (prendido). Los labios del beb deben crear un sello sobre la mama y estar doblados hacia afuera (invertidos).  Es comn que el beb succione durante 2 a 3 minutos para que comience el flujo de leche materna. Cmo debe prenderse Es muy importante que le ensee al beb cmo prenderse adecuadamente a la mama. Si el beb no se prende adecuadamente, puede causar dolor en los pezones, reducir la produccin de leche materna y hacer que el beb tenga un escaso aumento de peso. Adems, si el beb no se prende adecuadamente al pezn, puede tragar aire durante la alimentacin. Esto puede causarle molestias al beb. Hacer eructar al beb al cambiar de mama puede ayudarlo a liberar el aire. Sin embargo, ensearle al beb cmo prenderse a la mama adecuadamente es la mejor manera de evitar que se sienta molesto por tragar aire mientras se alimenta. Signos de que el beb se ha prendido adecuadamente al pezn  Tironea o succiona de modo silencioso, sin causarle dolor. Los labios del beb deben estar extendidos hacia afuera (evertidos).  Se escucha que traga cada 3 o 4 succiones una vez que la leche ha comenzado a fluir (despus de que se produzca el reflejo de eyeccin de la leche).  Hay movimientos musculares por arriba y por delante de sus odos al succionar. Signos de que el beb no se ha prendido adecuadamente al pezn  Hace ruidos de succin o de chasquido mientras se alimenta.  Siente dolor en los pezones. Si cree que el beb no se prendi correctamente, deslice el dedo en la comisura de la boca y colquelo entre las encas del beb para interrumpir la succin. Intente volver a comenzar a amamantar. Signos de lactancia materna exitosa Signos del beb  El beb disminuir gradualmente el nmero de succiones o dejar de succionar  por completo.  El beb se quedar dormido.  El cuerpo del beb se relajar.  El beb retendr una pequea cantidad de leche en la boca.  El beb se desprender solo del pecho. Signos que presenta usted  Las mamas han aumentado la firmeza, el peso y el tamao 1 a 3 horas despus de amamantar.  Estn ms   blandas inmediatamente despus de amamantar.  Se producen un aumento del volumen de leche y un cambio en su consistencia y color hacia el quinto da de lactancia.  Los pezones no duelen, no estn agrietados ni sangran. Signos de que su beb recibe la cantidad de leche suficiente  Mojar por lo menos 1 o 2paales durante las primeras 24horas despus del nacimiento.  Mojar por lo menos 5 o 6paales cada 24horas durante la primera semana despus del nacimiento. La orina debe ser clara o de color amarillo plido a los 5das de vida.  Mojar entre 6 y 8paales cada 24horas a medida que el beb sigue creciendo y desarrollndose.  Defeca por lo menos 3 veces en 24 horas a los 5 das de vida. Las heces deben ser blandas y amarillentas.  Defeca por lo menos 3 veces en 24 horas a los 7 das de vida. Las heces deben ser grumosas y amarillentas.  No registra una prdida de peso mayor al 10% del peso al nacer durante los primeros 3 das de vida.  Aumenta de peso un promedio de 4 a 7onzas (113 a 198g) por semana despus de los 4 das de vida.  Aumenta de peso, diariamente, de manera uniforme a partir de los 5 das de vida, sin registrar prdida de peso despus de las 2semanas de vida. Despus de alimentarse, es posible que el beb regurgite una pequea cantidad de leche. Esto es normal. Frecuencia y duracin de la lactancia El amamantamiento frecuente la ayudar a producir ms leche y puede prevenir dolores en los pezones y las mamas extremadamente llenas (congestin mamaria). Alimente al beb cuando muestre signos de hambre o si siente la necesidad de reducir la congestin de las  mamas. Esto se denomina "lactancia a demanda". Las seales de que el beb tiene hambre incluyen las siguientes:  Aumento del estado de alerta, actividad o inquietud.  Mueve la cabeza de un lado a otro.  Abre la boca cuando se le toca la mejilla o la comisura de la boca (reflejo de bsqueda).  Aumenta las vocalizaciones, tales como sonidos de succin, se relame los labios, emite arrullos, suspiros o chirridos.  Mueve la mano hacia la boca y se chupa los dedos o las manos.  Est molesto o llora. Evite el uso del chupete en las primeras 4 a 6 semanas despus del nacimiento del beb. Despus de este perodo, podr usar un chupete. Las investigaciones demostraron que el uso del chupete durante el primer ao de vida del beb disminuye el riesgo de tener el sndrome de muerte sbita del lactante (SMSL). Permita que el nio se alimente en cada mama todo lo que desee. Cuando el beb se desprende o se queda dormido mientras se est alimentando de la primera mama, ofrzcale la segunda. Debido a que, con frecuencia, los recin nacidos estn somnolientos las primeras semanas de vida, es posible que deba despertar al beb para alimentarlo. Los horarios de lactancia varan de un beb a otro. Sin embargo, las siguientes reglas pueden servir como gua para ayudarla a garantizar que el beb se alimenta adecuadamente:  Se puede amamantar a los recin nacidos (bebs de 4 semanas o menos de vida) cada 1 a 3 horas.  No deben transcurrir ms de 3 horas durante el da o 5 horas durante la noche sin que se amamante a los recin nacidos.  Debe amamantar al beb un mnimo de 8 veces en un perodo de 24 horas. Extraccin de leche materna La extraccin y el almacenamiento de   la leche materna le permiten asegurarse de que el beb se alimente exclusivamente de su leche materna, aun en momentos en los que no puede amamantar. Esto tiene especial importancia si debe regresar al trabajo en el perodo en que an est amamantando  o si no puede estar presente en los momentos en que el beb debe alimentarse. Su asesor en lactancia puede ayudarla a encontrar un mtodo de extraccin que funcione mejor para usted y orientarla sobre cunto tiempo es seguro almacenar leche materna.      Cmo cuidar las mamas durante la lactancia Los pezones pueden secarse, agrietarse y doler durante la lactancia. Las siguientes recomendaciones pueden ayudarla a mantener las mamas humectadas y sanas:  Evite usar jabn en los pezones.  Use un sostn de soporte diseado especialmente para la lactancia materna. Evite usar sostenes con aro o sostenes muy ajustados (sostenes deportivos).  Seque al aire sus pezones durante 3 a 4minutos despus de amamantar al beb.  Utilice solo apsitos de algodn en el sostn para absorber las prdidas de leche. La prdida de un poco de leche materna entre las tomas es normal.  Utilice lanolina sobre los pezones luego de amamantar. La lanolina ayuda a mantener la humedad normal de la piel. La lanolina pura no es perjudicial (no es txica) para el beb. Adems, puede extraer manualmente algunas gotas de leche materna y masajear suavemente esa leche sobre los pezones para que la leche se seque al aire. Durante las primeras semanas despus del nacimiento, algunas mujeres experimentan congestin mamaria. La congestin mamaria puede hacer que sienta las mamas pesadas, calientes y sensibles al tacto. El pico de la congestin mamaria ocurre en el plazo de los 3 a 5 das despus del parto. Las siguientes recomendaciones pueden ayudarla a aliviar la congestin mamaria:  Vace por completo las mamas al amamantar o extraer leche. Puede aplicar calor hmedo en las mamas (en la ducha o con toallas hmedas para manos) antes de amamantar o extraer leche. Esto aumenta la circulacin y ayuda a que la leche fluya. Si el beb no vaca por completo las mamas cuando lo amamanta, extraiga la leche restante despus de que haya  finalizado.  Aplique compresas de hielo sobre las mamas inmediatamente despus de amamantar o extraer leche, a menos que le resulte demasiado incmodo. Haga lo siguiente: ? Ponga el hielo en una bolsa plstica. ? Coloque una toalla entre la piel y la bolsa de hielo. ? Coloque el hielo durante 20minutos, 2 o 3veces por da.  Asegrese de que el beb est prendido y se encuentre en la posicin correcta mientras lo alimenta. Si la congestin mamaria persiste luego de 48 horas o despus de seguir estas recomendaciones, comunquese con su mdico o un asesor en lactancia. Recomendaciones de salud general durante la lactancia  Consuma 3 comidas y 3 colaciones saludables todos los das. Las madres bien alimentadas que amamantan necesitan entre 450 y 500 caloras adicionales por da. Puede cumplir con este requisito al aumentar la cantidad de una dieta equilibrada que realice.  Beba suficiente agua para mantener la orina clara o de color amarillo plido.  Descanse con frecuencia, reljese y siga tomando sus vitaminas prenatales para prevenir la fatiga, el estrs y los niveles bajos de vitaminas y minerales en el cuerpo (deficiencias de nutrientes).  No consuma ningn producto que contenga nicotina o tabaco, como cigarrillos y cigarrillos electrnicos. El beb puede verse afectado por las sustancias qumicas de los cigarrillos que pasan a la leche materna y por la exposicin   al humo ambiental del tabaco. Si necesita ayuda para dejar de fumar, consulte al mdico.  Evite el consumo de alcohol.  No consuma drogas ilegales o marihuana.  Antes de usar cualquier medicamento, hable con el mdico. Estos incluyen medicamentos recetados y de venta libre, como tambin vitaminas y suplementos a base de hierbas. Algunos medicamentos, que pueden ser perjudiciales para el beb, pueden pasar a travs de la leche materna.  Puede quedar embarazada durante la lactancia. Si se desea un mtodo anticonceptivo, consulte  al mdico sobre cules son las opciones seguras durante la lactancia. Dnde encontrar ms informacin: Liga internacional [place]ec[place]: www.llli.org. Comunquese con un mdico si:  Siente que quiere dejar de amamantar o se siente frustrada con la lactancia.  Sus pezones estn agrietados o sangran.  Sus mamas estn irritadas, sensibles o calientes.  Tiene los siguientes sntomas: ? Dolor en las mamas o en los pezones. ? Un rea hinchada en cualquiera de las mamas. ? Fiebre o escalofros. ? Nuseas o vmitos. ? Drenaje de otro lquido distinto de la leche materna desde los pezones.  Sus mamas no se llenan antes de amamantar al beb para el quinto da despus del parto.  Se siente triste y deprimida.  El beb: ? Est demasiado somnoliento como para comer bien. ? Tiene problemas para dormir. ? Tiene ms de 1 semana de vida y moja menos de 6 paales en un periodo de 24 horas. ? No ha aumentado de peso a los 5 das de vida.  El beb defeca menos de 3 veces en 24 horas.  La piel del beb o las partes blancas de los ojos se vuelven amarillentas. Solicite ayuda de inmediato si:  El beb est muy cansado (letargo) y no se quiere despertar para comer.  Le sube la fiebre sin causa. Resumen  La lactancia materna ofrece muchos beneficios de salud para bebs y madres.  Intente amamantar a su beb cuando muestre signos tempranos de hambre.  Haga cosquillas o empuje suavemente los labios del beb con el dedo o el pezn para lograr que el beb abra la boca. Acerque el beb a la mama. Asegrese de que la mayor parte de la arola se encuentre dentro de la boca del beb. Ofrzcale una mama y haga eructar al beb antes de pasar a la otra.  Hable con su mdico o asesor en lactancia si tiene dudas o problemas con la lactancia. Esta informacin no tiene como fin reemplazar el consejo del mdico. Asegrese de hacerle al mdico cualquier pregunta que tenga. Document Revised: 11/21/2017 Document  Reviewed: 12/17/2016 Elsevier Patient Education  2021 Elsevier Inc.  

## 2020-11-18 NOTE — Progress Notes (Signed)
Complains of tightening of belly that started about a month ago. She says not does not happen often. Says that she has not been in pain

## 2020-11-18 NOTE — Progress Notes (Signed)
° °  Subjective:  Belinda May is a 20 y.o. G1P0000 at 109w5d being seen today for ongoing prenatal care.  She is currently monitored for the following issues for this low-risk pregnancy and has PCOS (polycystic ovarian syndrome); Supervision of other normal pregnancy, antepartum; History of appendectomy; and Gastritis on their problem list.  Patient reports no complaints.  Contractions: Irregular. Vag. Bleeding: None.  Movement: Present. Denies leaking of fluid.   The following portions of the patient's history were reviewed and updated as appropriate: allergies, current medications, past family history, past medical history, past social history, past surgical history and problem list. Problem list updated.  Objective:   Vitals:   11/18/20 0844  BP: 115/63  Pulse: 87  Weight: 192 lb (87.1 kg)    Fetal Status: Fetal Heart Rate (bpm): 152   Movement: Present     General:  Alert, oriented and cooperative. Patient is in no acute distress.  Skin: Skin is warm and dry. No rash noted.   Cardiovascular: Normal heart rate noted  Respiratory: Normal respiratory effort, no problems with respiration noted  Abdomen: Soft, gravid, appropriate for gestational age. Pain/Pressure: Present     Pelvic: Vag. Bleeding: None     Cervical exam deferred        Extremities: Normal range of motion.  Edema: None  Mental Status: Normal mood and affect. Normal behavior. Normal judgment and thought content.   Urinalysis:      Assessment and Plan:  Pregnancy: G1P0000 at [redacted]w[redacted]d  1. Supervision of other normal pregnancy, antepartum BP and FHR normal Anatomy scan normal 28wk labs at next visit  2. Gastritis without bleeding, unspecified chronicity, unspecified gastritis type Improved  Preterm labor symptoms and general obstetric precautions including but not limited to vaginal bleeding, contractions, leaking of fluid and fetal movement were reviewed in detail with the patient. Please refer to After  Visit Summary for other counseling recommendations.  Return in 4 weeks (on 12/16/2020) for [facility], ob visit, 28 wk labs.   Venora Maples, MD

## 2020-11-25 ENCOUNTER — Other Ambulatory Visit: Payer: Self-pay | Admitting: *Deleted

## 2020-11-25 ENCOUNTER — Ambulatory Visit: Payer: Self-pay | Attending: Maternal & Fetal Medicine

## 2020-11-25 ENCOUNTER — Ambulatory Visit: Payer: Self-pay

## 2020-11-25 ENCOUNTER — Other Ambulatory Visit: Payer: Self-pay

## 2020-11-25 DIAGNOSIS — Z362 Encounter for other antenatal screening follow-up: Secondary | ICD-10-CM | POA: Insufficient documentation

## 2020-11-25 DIAGNOSIS — O43199 Other malformation of placenta, unspecified trimester: Secondary | ICD-10-CM

## 2020-12-05 ENCOUNTER — Other Ambulatory Visit: Payer: Self-pay

## 2020-12-05 ENCOUNTER — Inpatient Hospital Stay (HOSPITAL_COMMUNITY)
Admission: AD | Admit: 2020-12-05 | Discharge: 2020-12-06 | Disposition: A | Discharge status: Home / Self Care | Payer: Self-pay | Attending: Obstetrics and Gynecology | Admitting: Obstetrics and Gynecology

## 2020-12-05 ENCOUNTER — Encounter (HOSPITAL_COMMUNITY): Payer: Self-pay | Admitting: Obstetrics and Gynecology

## 2020-12-05 DIAGNOSIS — Z3689 Encounter for other specified antenatal screening: Secondary | ICD-10-CM

## 2020-12-05 DIAGNOSIS — O26892 Other specified pregnancy related conditions, second trimester: Secondary | ICD-10-CM | POA: Insufficient documentation

## 2020-12-05 DIAGNOSIS — Z3A25 25 weeks gestation of pregnancy: Secondary | ICD-10-CM | POA: Insufficient documentation

## 2020-12-05 DIAGNOSIS — M545 Low back pain, unspecified: Secondary | ICD-10-CM | POA: Insufficient documentation

## 2020-12-05 MED ORDER — CYCLOBENZAPRINE HCL 5 MG PO TABS
10.0000 mg | ORAL_TABLET | Freq: Once | ORAL | Status: AC
  Administered 2020-12-05: 10 mg via ORAL
  Filled 2020-12-05: qty 2

## 2020-12-05 MED ORDER — KETOROLAC TROMETHAMINE 30 MG/ML IJ SOLN: 30.0000 mg | Freq: Once | INTRAMUSCULAR | Status: DC

## 2020-12-05 MED ORDER — KETOROLAC TROMETHAMINE 30 MG/ML IJ SOLN
30.0000 mg | Freq: Once | INTRAMUSCULAR | Status: AC
  Administered 2020-12-05: 30 mg via INTRAMUSCULAR
  Filled 2020-12-05: qty 1

## 2020-12-05 NOTE — MAU Provider Note (Signed)
Event Date/Time   First Provider Initiated Contact with Patient 12/05/20 2241     S Ms. Makilah Dowda is a 20 y.o. G1P0000 pregnant female at [redacted]w[redacted]d who presents to MAU today with complaint of low back pain originating in the center above her bottom, radiating into the top of her right leg. Pain began after dinner when she got up from the table. Rates it acute 7-8/10, has never had back pain or sciatica before. Denies contractions, cramping, vaginal bleeding/discharge, LOF or DFM. No fever, falls or recent illness. No other physical complaints. Receives care at [facility]  Comprehensive ROS complete and negative except what is listed in HPI.  O BP 125/70 (BP Location: Right Arm)   Pulse 88   Temp 98.3 F (36.8 C) (Oral)   Resp 18   Ht 5\' 2"  (1.575 m)   Wt 198 lb 3.2 oz (89.9 kg)   LMP 06/12/2020   SpO2 100%   BMI 36.25 kg/m  Physical Exam Vitals and nursing note reviewed. Exam conducted with a chaperone present.  Constitutional:      General: She is in acute distress (if moving from laying to sitting or sitting to standing).     Appearance: She is not ill-appearing.  HENT:     Head: Normocephalic and atraumatic.     Mouth/Throat:     Mouth: Mucous membranes are dry.  Eyes:     Pupils: Pupils are equal, round, and reactive to light.  Cardiovascular:     Rate and Rhythm: Normal rate.     Pulses: Normal pulses.  Pulmonary:     Effort: Pulmonary effort is normal.  Musculoskeletal:        General: No swelling, tenderness or signs of injury. Normal range of motion.  Skin:    General: Skin is warm and dry.     Capillary Refill: Capillary refill takes less than 2 seconds.     Findings: No bruising, erythema or lesion.  Neurological:     Mental Status: She is alert and oriented to person, place, and time.  Psychiatric:        Mood and Affect: Mood normal.        Behavior: Behavior normal.        Thought Content: Thought content normal.        Judgment: Judgment normal.    Fetal Tracing: reactive Baseline: 150 Variability: minimal Accelerations: 10x10 (appropriate for gestational age) Decelerations: none Toco: relaxed  Hot pack applied to lower back. Toradol and flexeril given with almost complete relief of pain. Educated on importance of regular stretching/daily movement. Encouraged use of heating pad for recovery.   A [redacted] weeks gestation Back pain in pregnancy NST reactive  P Discharge from MAU in stable condition Follow up at [facility] as scheduled for ongoing prenatal care Warning signs for worsening condition that would warrant emergency follow-up discussed  [facility], CNM 12/05/2020 11:03 PM

## 2020-12-05 NOTE — MAU Note (Signed)
PT SAYS HER LOWER  BACK HURTS- RADIATES TO RIGHT BUTTOCKS - STARTED TODAY A T 2 PM-  HAS NOT TAKEN ANY MEDS- ( IS ALLERGIC TO TYLENOL) PNC WITH ECKSTAT- WAS IN OFFICE 3-18.

## 2020-12-06 DIAGNOSIS — O26892 Other specified pregnancy related conditions, second trimester: Secondary | ICD-10-CM

## 2020-12-06 DIAGNOSIS — M545 Low back pain, unspecified: Secondary | ICD-10-CM

## 2020-12-06 DIAGNOSIS — Z3A25 25 weeks gestation of pregnancy: Secondary | ICD-10-CM

## 2020-12-06 MED ORDER — CYCLOBENZAPRINE HCL 10 MG PO TABS: 10.0000 mg | ORAL_TABLET | Freq: Three times a day (TID) | ORAL | 1 refills | Status: DC | PRN

## 2020-12-06 NOTE — Discharge Instructions (Signed)
Dolor de Merchandiser, retail Back Pain in Pregnancy El dolor de espalda es habitual durante el [place]. Puede deberse a varios factores relacionados con los cambios durante esta etapa. Siga estas indicaciones en su casa: Control del dolor, el entumecimiento y la hinchazn  Si se lo indican, para el dolor de espalda repentino (agudo), aplique hielo en la zona dolorida. ? Ponga el hielo en una bolsa plstica. ? Coloque una [employer] piel y Copy. ? Coloque el hielo durante , 2a3veces al da.  Si se lo indican, aplique calor en la zona afectada antes de realizar ejercicios. Use la fuente de calor que el mdico le recomiende, como una compresa de calor hmedo o una almohadilla trmica. ? Coloque una [employer] piel y la fuente de Airline pilot. ? Aplique calor durante 20 a . ? Retire la fuente de calor si la piel se pone de color rojo brillante. Esto es especialmente importante si no puede sentir dolor, calor o fro. Puede correr un riesgo mayor de sufrir quemaduras.  Si se lo indican, aplique un masaje en la zona afectada.      Actividad  Haga ejercicio como se lo haya indicado el mdico. Hacer actividad fsica suave es la mejor forma de evitar o controlar el dolor de espalda.  Prstele atencin a su cuerpo cuando se levante. Si siente dolor al levantarse, pida ayuda o flexione las rodillas. [employer], se usan los msculos de las piernas en lugar de los de la espalda.  Pngase en cuclillas al levantar algo del suelo. No se agache.  Haga reposo en cama nicamente por perodos breves como se lo haya indicado el mdico. El reposo en cama solo debe hacerse cuando los episodios de dolor de espalda son ms intensos. Pararse, sentarse y acostarse  No permanezca sentada o de pie en el mismo lugar durante largos perodos.  Cuando est sentada, adopte una Education officer, museum. Asegrese de que su cabeza descanse sobre sus hombros y no est colgando hacia  delante. Use una almohada en la parte inferior de la espalda si es necesario.  Trate de dormir de lado, de preferencia del lado izquierdo, con una almohada de sostn para embarazadas o 1 o 2 almohadas comunes entre las piernas. ? Si tiene Engineer, mining de espalda despus de una noche de descanso, la cama puede ser [employer]. ? Un colchn duro puede brindarle ms apoyo para la Merchandiser, retail. Indicaciones generales  No use zapatos con tacones altos.  Siga una dieta saludable. Trate de aumentar de peso dentro de las recomendaciones del mdico.  Use una faja de maternidad, un arns elstico o un cors para la espalda como se lo haya indicado el mdico.  Tome los medicamentos de venta libre y los recetados solamente como se lo haya indicado el mdico.  Building control surveyor con un fisioterapeuta o un masajista para Engineer, manufacturing systems de Human resources officer de espalda. La acupuntura o la terapia de masajes pueden ser tiles.  Concurra a todas las visitas de control como se lo haya indicado el mdico. Esto es importante. Comunquese con un mdico si:  El dolor de Event organiser impide [employer] cotidianas.  Aumenta el dolor en otras partes del cuerpo. Solicite ayuda inmediatamente si:  Siente entumecimiento, hormigueo, debilidad o problemas con el uso de los brazos o las piernas.  Siente un dolor de espalda intenso que no puede controlar con los medicamentos.  Presenta modificaciones en el control de la vejiga o el  intestino.  Siente que le falta el aire, se marea o se desmaya.  Tiene nuseas, vmitos o sudoracin.  Siente un dolor de espalda que es rtmico y de tipo clico, similar a las contracciones del [place]. Las contracciones del parto suelen aparecer cada 1 a , duran aproximadamente y estn acompaadas de una sensacin de empujar o de presin en la pelvis.  Tiene dolor de espalda y rompe la bolsa de las aguas o tiene sangrado vaginal.  El dolor o el  adormecimiento se extienden hacia la pierna.  El dolor aparece despus de una cada.  Siente dolor de un solo lado.  Observa sangre en la orina.  Le aparecen ampollas en la piel en la zona del dolor de espalda. Resumen  Puede deberse a varios factores relacionados con los cambios durante esta etapa.  Siga las indicaciones del mdico para Human resources officer, la rigidez y la hinchazn.  Haga ejercicio como se lo haya indicado el mdico. Hacer actividad fsica suave es la mejor forma de evitar o controlar el dolor de espalda.  Tome los medicamentos de venta libre y los recetados solamente como se lo haya indicado el mdico.  Oceanographer a todas las visitas de control como se lo haya indicado el mdico. Esto es importante. Esta informacin no tiene Theme park manager el consejo del mdico. Asegrese de hacerle al mdico cualquier pregunta que tenga. Document Revised: 04/07/2018 Document Reviewed: 04/07/2018 Elsevier Patient Education  2021 Elsevier Inc. [employer]  La citica es el [place], debilidad, hormigueo o prdida de la sensibilidad (adormecimiento) a lo largo del nervio citico. El nervio citico comienza en la parte inferior de la espalda y desciende por la parte posterior de cada pierna. Suele desaparecer por s sola o con tratamiento. A veces, la citica puede volver a aparecer (ser recurrente). Cules son las causas? Esta afeccin se produce cuando el nervio citico se comprime o se ejerce presin sobre l. Esto puede ser el resultado de:  Un disco que sobresale demasiado entre los huesos de la columna vertebral (hernia de disco).  Los cambios que se producen [employer] discos vertebrales al Secretary/administrator.  Una afeccin en un msculo de las nalgas.  Un crecimiento seo adicional cerca del nervio citico.  Una rotura (fractura) de la zona que est entre los huesos de la cadera (pelvis).  Embarazo.  Tumor. Esto es poco frecuente. Qu incrementa el riesgo? Es ms  probable que tengan esta afeccin las personas que:  Software engineer deportes que ponen presin o tensin sobre la columna vertebral.  Tienen poca fuerza y facilidad de movimiento (flexibilidad).  Han tenido una lesin en la espalda en el pasado.  Han tenido una ciruga en la espalda.  Permanecen sentadas durante largos perodos.  Realizan actividades que implican agacharse o levantar objetos una y [place].  Tienen mucho sobrepeso (es obeso). Cules son los signos o los sntomas? Los sntomas pueden variar de leves a muy graves. Pueden incluir los siguientes:  Cualquiera de los siguientes problemas en la parte inferior de la espalda, piernas, cadera o nalgas: ? Hormigueo leve, prdida de la sensibilidad o dolor sordo. ? Sensacin de ardor. ? Dolor agudo.  Prdida de la sensibilidad en la parte posterior de la pantorrilla o la planta del pie.  Debilidad en las piernas.  Dolor muy intenso en la espalda que dificulta el movimiento. Estos sntomas pueden empeorar al toser, Engineering geologist o rer. Tambin pueden empeorar al sentarse o estar de pie durante largos perodos. Cmo se trata? A  menudo, esta afeccin mejora sin tratamiento. Sin embargo, el tratamiento puede incluir:  Multimedia programmer de [place] fsica o reducirla cuando siente dolor.  Hacer ejercicios y estiramientos.  Aplicar hielo o calor sobre la zona afectada.  Medicamentos para lo siguiente: ? Aliviar el dolor y la inflamacin. ? Relajar los msculos.  Inyecciones de medicamentos que ayudan a Engineer, materials, la irritacin y la hinchazn.  Ciruga. Siga estas instrucciones en su casa: Medicamentos  [employer] de venta libre y los recetados solamente como se lo haya indicado el mdico.  Consulte a su mdico si el medicamento que le recetaron: ? Hace que sea necesario que evite conducir o usar maquinaria pesada. ? Puede causarle dificultad para defecar (estreimiento). Es posible que deba tomar estas medidas  para prevenir o tratar los problemas para defecar:  Product manager suficiente lquido para Radio producer pis (la orina) de color amarillo plido.  Tomar medicamentos recetados o de [employer].  Comer alimentos ricos en fibra. Entre ellos, frijoles, cereales integrales y frutas y verduras frescas.  Limitar los alimentos con alto contenido de grasa y International aid/development worker. Estos incluyen alimentos fritos o dulces. Control del dolor  Si se lo indican, aplique hielo en la zona afectada. ? Ponga el hielo en una bolsa plstica. ? Coloque una [employer] piel y Copy. ? Coloque el hielo durante , 2 a 3veces por da.  Si se lo indican, aplique calor en la zona afectada. Use la fuente de calor que el mdico le indique, por ejemplo, una compresa de calor hmedo o una almohadilla trmica. ? Coloque una [employer] piel y la fuente de Airline pilot. ? Aplique calor durante 20 a . ? Retire la fuente de calor si la piel se pone de color rojo brillante. Esto es muy importante si no puede Financial risk analyst, calor o fro. Puede correr un riesgo mayor de sufrir quemaduras.      Actividad  Retome sus actividades habituales como se lo haya indicado el mdico. Pregntele al mdico qu actividades son seguras para usted.  Evite las [employer] sntomas.  Descanse por breves perodos Administrator. ? Cuando descanse durante perodos ms largos, haga alguna actividad fsica o un estiramiento entre los perodos de descanso. ? Evite estar sentado durante largos perodos sin moverse. Levntese y [place] al menos una vez cada hora.  Haga ejercicios y estrese con regularidad, como se lo indic el mdico.  No levante nada que pese ms de 10libras (4.5kg) mientras tenga sntomas de citica. ? Aunque no tenga sntomas, evite levantar objetos pesados. ? Evite levantar objetos pesados de forma repetida.  Al levantar objetos, hgalo siempre de una forma que sea segura para su cuerpo. Para esto,  debe hacer lo siguiente: ? Flexione las rodillas. ? Mantenga el objeto cerca del cuerpo. ? No gire el cuerpo.   Instrucciones generales  Mantenga un peso saludable.  Use calzado cmodo, que le d soporte al pie. Evite usar tacones.  Evite dormir sobre un colchn que sea demasiado blando o demasiado duro. Es posible que sienta menos dolor si duerme en un colchn con apoyo suficientemente firme para la espalda.  Concurra a todas las visitas de [address] se lo haya indicado el mdico. Esto es importante. Comunquese con un mdico si:  Tiene un dolor con estas caractersticas: ? Lo despierta cuando est dormido. ? Empeora al estar recostado. ? Es Government social research officer que tena en el pasado. ? Dura ms de 4semanas.  Pierde peso  sin proponrselo. Solicite ayuda inmediatamente si:  No puede controlar la orina (miccin) ni la evacuacin de la materia fecal (defecacin).  Tiene debilidad en alguna de estas zonas, y la debilidad empeora: ? La parte inferior de la espalda. ? La zona que se encuentra entre los Affiliated Computer Serviceshuesos de las caderas. ? Las nalgas. ? Las piernas.  Siente irritacin o inflamacin en la espalda.  Tiene sensacin de ardor al ConocoPhillipsorinar. Resumen  La citica es el dolor, debilidad, hormigueo o prdida de la sensibilidad (adormecimiento) a lo largo del nervio citico.  Esta afeccin se produce cuando el nervio citico se comprime o se ejerce presin sobre l.  La citica puede Programmer, multimediacausar dolor, hormigueo o prdida de la sensibilidad (adormecimiento) en la parte inferior de la espalda, las piernas, las caderas y las nalgas.  El tratamiento a menudo incluye reposo, ejercicio, medicamentos y Contractoraplicar hielo o calor en la zona afectada. Esta informacin no tiene Theme park managercomo fin reemplazar el consejo del mdico. Asegrese de hacerle al mdico cualquier pregunta que tenga. Document Revised: 10/22/2018 Document Reviewed: 10/22/2018 Elsevier Patient Education  2021 [employer]vier [employer].

## 2020-12-12 ENCOUNTER — Other Ambulatory Visit: Payer: Self-pay | Admitting: Lactation Services

## 2020-12-12 DIAGNOSIS — Z3480 Encounter for supervision of other normal pregnancy, unspecified trimester: Secondary | ICD-10-CM

## 2020-12-19 ENCOUNTER — Ambulatory Visit (INDEPENDENT_AMBULATORY_CARE_PROVIDER_SITE_OTHER): Payer: Self-pay | Admitting: Obstetrics and Gynecology

## 2020-12-19 ENCOUNTER — Other Ambulatory Visit: Payer: Self-pay

## 2020-12-19 DIAGNOSIS — Z3480 Encounter for supervision of other normal pregnancy, unspecified trimester: Secondary | ICD-10-CM

## 2020-12-19 DIAGNOSIS — Z23 Encounter for immunization: Secondary | ICD-10-CM

## 2020-12-19 DIAGNOSIS — O43199 Other malformation of placenta, unspecified trimester: Secondary | ICD-10-CM | POA: Insufficient documentation

## 2020-12-19 NOTE — Progress Notes (Signed)
   PRENATAL VISIT NOTE  Subjective:  Belinda May is a 20 y.o. G1P0000 at [redacted]w[redacted]d being seen today for ongoing prenatal care.  She is currently monitored for the following issues for this low-risk pregnancy and has PCOS (polycystic ovarian syndrome); Supervision of other normal pregnancy, antepartum; History of appendectomy; Gastritis; and Marginal insertion of umbilical cord affecting management of mother on their problem list.  Patient reports no complaints.  Contractions: Not present. Vag. Bleeding: None.  Movement: Present. Denies leaking of fluid.   The following portions of the patient's history were reviewed and updated as appropriate: allergies, current medications, past family history, past medical history, past social history, past surgical history and problem list.   Objective:   Vitals:   12/19/20 0826  BP: 109/74  Pulse: (!) 106  Weight: 200 lb 8 oz (90.9 kg)    Fetal Status: Fetal Heart Rate (bpm): 135   Movement: Present     General:  Alert, oriented and cooperative. Patient is in no acute distress.  Skin: Skin is warm and dry. No rash noted.   Cardiovascular: Normal heart rate noted  Respiratory: Normal respiratory effort, no problems with respiration noted  Abdomen: Soft, gravid, appropriate for gestational age.  Pain/Pressure: Absent     Pelvic: Cervical exam deferred        Extremities: Normal range of motion.  Edema: None  Mental Status: Normal mood and affect. Normal behavior. Normal judgment and thought content.   Assessment and Plan:  Pregnancy: G1P0000 at [redacted]w[redacted]d  1. Marginal insertion of umbilical cord affecting management of mother   2. Supervision of other normal pregnancy, antepartum  - Tdap vaccine greater than or equal to 7yo IM - 2 hour GTT today - discussed/ reviewed [facility] web site - interpretor present for visit.   Preterm labor symptoms and general obstetric precautions including but not limited to vaginal bleeding,  contractions, leaking of fluid and fetal movement were reviewed in detail with the patient. Please refer to After Visit Summary for other counseling recommendations.   No follow-ups on file.  Future Appointments  Date Time Provider Department Center  12/19/2020  8:50 AM [facility] LAB [facility]  12/30/2020 10:30 AM [facility] NURSE [facility]  12/30/2020 10:45 AM [facility] US5 [facility]    Venia Carbon, NP

## 2020-12-19 NOTE — Patient Instructions (Signed)
[facility]. Com   Class for pregnancy.

## 2020-12-20 LAB — HIV ANTIBODY (ROUTINE TESTING W REFLEX): HIV Screen 4th Generation wRfx: NONREACTIVE

## 2020-12-20 LAB — GLUCOSE TOLERANCE, 2 HOURS W/ 1HR
Glucose, 1 hour: 134 mg/dL (ref 65–179)
Glucose, 2 hour: 91 mg/dL (ref 65–152)
Glucose, Fasting: 74 mg/dL (ref 65–91)

## 2020-12-20 LAB — CBC
Hematocrit: 35.7 % (ref 34.0–46.6)
Hemoglobin: 11.7 g/dL (ref 11.1–15.9)
MCH: 29.5 pg (ref 26.6–33.0)
MCHC: 32.8 g/dL (ref 31.5–35.7)
MCV: 90 fL (ref 79–97)
Platelets: 216 10*3/uL (ref 150–450)
RBC: 3.96 x10E6/uL (ref 3.77–5.28)
RDW: 13.1 % (ref 11.7–15.4)
WBC: 10.5 10*3/uL (ref 3.4–10.8)

## 2020-12-20 LAB — RPR: RPR Ser Ql: NONREACTIVE

## 2020-12-30 ENCOUNTER — Ambulatory Visit: Payer: Self-pay | Admitting: *Deleted

## 2020-12-30 ENCOUNTER — Encounter: Payer: Self-pay | Admitting: *Deleted

## 2020-12-30 ENCOUNTER — Ambulatory Visit: Payer: Self-pay | Attending: Obstetrics and Gynecology

## 2020-12-30 ENCOUNTER — Other Ambulatory Visit: Payer: Self-pay

## 2020-12-30 ENCOUNTER — Other Ambulatory Visit: Payer: Self-pay | Admitting: *Deleted

## 2020-12-30 DIAGNOSIS — O43199 Other malformation of placenta, unspecified trimester: Secondary | ICD-10-CM | POA: Insufficient documentation

## 2020-12-30 DIAGNOSIS — O43193 Other malformation of placenta, third trimester: Secondary | ICD-10-CM

## 2020-12-30 DIAGNOSIS — Z362 Encounter for other antenatal screening follow-up: Secondary | ICD-10-CM

## 2020-12-30 DIAGNOSIS — O321XX0 Maternal care for breech presentation, not applicable or unspecified: Secondary | ICD-10-CM

## 2020-12-30 DIAGNOSIS — O99213 Obesity complicating pregnancy, third trimester: Secondary | ICD-10-CM

## 2020-12-30 DIAGNOSIS — Z3A28 28 weeks gestation of pregnancy: Secondary | ICD-10-CM

## 2020-12-30 DIAGNOSIS — Z3480 Encounter for supervision of other normal pregnancy, unspecified trimester: Secondary | ICD-10-CM | POA: Insufficient documentation

## 2020-12-30 DIAGNOSIS — E669 Obesity, unspecified: Secondary | ICD-10-CM

## 2021-01-05 ENCOUNTER — Other Ambulatory Visit: Payer: Self-pay

## 2021-01-05 ENCOUNTER — Encounter: Payer: Self-pay | Admitting: Medical

## 2021-01-05 ENCOUNTER — Ambulatory Visit (INDEPENDENT_AMBULATORY_CARE_PROVIDER_SITE_OTHER): Payer: Self-pay | Admitting: Medical

## 2021-01-05 VITALS — BP 115/78 | HR 114 | Wt 204.1 lb

## 2021-01-05 DIAGNOSIS — R12 Heartburn: Secondary | ICD-10-CM

## 2021-01-05 DIAGNOSIS — O26893 Other specified pregnancy related conditions, third trimester: Secondary | ICD-10-CM

## 2021-01-05 DIAGNOSIS — Z3480 Encounter for supervision of other normal pregnancy, unspecified trimester: Secondary | ICD-10-CM

## 2021-01-05 MED ORDER — PANTOPRAZOLE SODIUM 20 MG PO TBEC: 20.0000 mg | DELAYED_RELEASE_TABLET | Freq: Two times a day (BID) | ORAL | 3 refills | Status: DC

## 2021-01-05 NOTE — Progress Notes (Signed)
   PRENATAL VISIT NOTE  Subjective:  Belinda May is a 20 y.o. G1P0000 at [redacted]w[redacted]d being seen today for ongoing prenatal care.  She is currently monitored for the following issues for this low-risk pregnancy and has PCOS (polycystic ovarian syndrome); Supervision of other normal pregnancy, antepartum; History of appendectomy; Gastritis; and Marginal insertion of umbilical cord affecting management of mother on their problem list.  Patient reports heartburn.  Contractions: Not present. Vag. Bleeding: None.  Movement: Present. Denies leaking of fluid.   The following portions of the patient's history were reviewed and updated as appropriate: allergies, current medications, past family history, past medical history, past social history, past surgical history and problem list.   Objective:   Vitals:   01/05/21 1050  BP: 115/78  Pulse: (!) 114  Weight: 204 lb 1.6 oz (92.6 kg)    Fetal Status: Fetal Heart Rate (bpm): 157   Movement: Present     General:  Alert, oriented and cooperative. Patient is in no acute distress.  Skin: Skin is warm and dry. No rash noted.   Cardiovascular: Normal heart rate noted  Respiratory: Normal respiratory effort, no problems with respiration noted  Abdomen: Soft, gravid, appropriate for gestational age.  Pain/Pressure: Absent     Pelvic: Cervical exam deferred        Extremities: Normal range of motion.  Edema: None  Mental Status: Normal mood and affect. Normal behavior. Normal judgment and thought content.   Assessment and Plan:  Pregnancy: G1P0000 at [redacted]w[redacted]d 1. Supervision of other normal pregnancy, antepartum - Normal third trimester labs reviewed  - Peds list given and discussed importance of choosing Peds prior to delivery  - Planning POPs for MOC  2. Heartburn in pregnancy in third trimester - Daily symptoms currently - Dietary changes explained and included on AVS - Discussed avoiding eating close to bedtime and elevating the head of the  bed - Start Protonix daily and add qHS dose if symptoms persist - pantoprazole (PROTONIX) 20 MG tablet; Take 1 tablet (20 mg total) by mouth 2 (two) times daily.  Dispense: 60 tablet; Refill: 3  Preterm labor symptoms and general obstetric precautions including but not limited to vaginal bleeding, contractions, leaking of fluid and fetal movement were reviewed in detail with the patient. Please refer to After Visit Summary for other counseling recommendations.   Return in about 2 weeks (around 01/19/2021) for LOB, any provider, In-Person.  Future Appointments  Date Time Provider Department Center  01/19/2021  9:35 AM [place] [place] [facility]  02/10/2021 10:30 AM [facility] NURSE [facility]  02/10/2021 10:45 AM [facility] US4 [facility]    Vonzella Nipple, PA-C

## 2021-01-05 NOTE — Progress Notes (Signed)
Patient complains of heartburn that started 2 months ago but stated that she has experienced this prior to pregnancy.

## 2021-01-05 NOTE — Patient Instructions (Addendum)
Fetal Movement Counts Patient Name: ________________________________________________ Patient Due Date: ____________________  What is a fetal movement count? A fetal movement count is the number of times that you feel your baby move during a certain amount of time. This may also be called a fetal kick count. A fetal movement count is recommended for every pregnant woman. You may be asked to start counting fetal movements as early as week 28 of your pregnancy. Pay attention to when your baby is most active. You may notice your baby's sleep and wake cycles. You may also notice things that make your baby move more. You should do a fetal movement count:  When your baby is normally most active.  At the same time each day. A good time to count movements is while you are resting, after having something to eat and drink. How do I count fetal movements? 1. Find a quiet, comfortable area. Sit, or lie down on your side. 2. Write down the date, the start time and stop time, and the number of movements that you felt between those two times. Take this information with you to your health care visits. 3. Write down your start time when you feel the first movement. 4. Count kicks, flutters, swishes, rolls, and jabs. You should feel at least 10 movements. 5. You may stop counting after you have felt 10 movements, or if you have been counting for 2 hours. Write down the stop time. 6. If you do not feel 10 movements in 2 hours, contact your health care provider for further instructions. Your health care provider may want to do additional tests to assess your baby's well-being. Contact a health care provider if:  You feel fewer than 10 movements in 2 hours.  Your baby is not moving like he or she usually does. Date: ____________ Start time: ____________ Stop time: ____________ Movements: ____________ Date: ____________ Start time: ____________ Stop time: ____________ Movements: ____________ Date: ____________  Start time: ____________ Stop time: ____________ Movements: ____________ Date: ____________ Start time: ____________ Stop time: ____________ Movements: ____________ Date: ____________ Start time: ____________ Stop time: ____________ Movements: ____________ Date: ____________ Start time: ____________ Stop time: ____________ Movements: ____________ Date: ____________ Start time: ____________ Stop time: ____________ Movements: ____________ Date: ____________ Start time: ____________ Stop time: ____________ Movements: ____________ Date: ____________ Start time: ____________ Stop time: ____________ Movements: ____________ This information is not intended to replace advice given to you by your health care provider. Make sure you discuss any questions you have with your health care provider. Document Revised: 04/16/2019 Document Reviewed: 04/16/2019 Elsevier Patient Education  2021 Elsevier Inc. Rosen's Emergency Medicine: Concepts and Clinical Practice (9th ed., pp. 2296- 2312). Elsevier.">  Braxton Hicks Contractions Contractions of the uterus can occur throughout pregnancy, but they are not always a sign that you are in labor. You may have practice contractions called Braxton Hicks contractions. These false labor contractions are sometimes confused with true labor. What are Braxton Hicks contractions? Braxton Hicks contractions are tightening movements that occur in the muscles of the uterus before labor. Unlike true labor contractions, these contractions do not result in opening (dilation) and thinning of the cervix. Toward the end of pregnancy (32-34 weeks), Braxton Hicks contractions can happen more often and may become stronger. These contractions are sometimes difficult to tell apart from true labor because they can be very uncomfortable. You should not feel embarrassed if you go to the hospital with false labor. Sometimes, the only way to tell if you are in true labor is for your   health care  provider to look for changes in the cervix. The health care provider will do a physical exam and may monitor your contractions. If you are not in true labor, the exam should show that your cervix is not dilating and your water has not broken. If there are no other health problems associated with your pregnancy, it is completely safe for you to be sent home with false labor. You may continue to have Braxton Hicks contractions until you go into true labor. How to tell the difference between true labor and false labor True labor  Contractions last 30-70 seconds.  Contractions become very regular.  Discomfort is usually felt in the top of the uterus, and it spreads to the lower abdomen and low back.  Contractions do not go away with walking.  Contractions usually become more intense and increase in frequency.  The cervix dilates and gets thinner. False labor  Contractions are usually shorter and not as strong as true labor contractions.  Contractions are usually irregular.  Contractions are often felt in the front of the lower abdomen and in the groin.  Contractions may go away when you walk around or change positions while lying down.  Contractions get weaker and are shorter-lasting as time goes on.  The cervix usually does not dilate or become thin. Follow these instructions at home:  Take over-the-counter and prescription medicines only as told by your health care provider.  Keep up with your usual exercises and follow other instructions from your health care provider.  Eat and drink lightly if you think you are going into labor.  If Braxton Hicks contractions are making you uncomfortable: ? Change your position from lying down or resting to walking, or change from walking to resting. ? Sit and rest in a tub of warm water. ? Drink enough fluid to keep your urine pale yellow. Dehydration may cause these contractions. ? Do slow and deep breathing several times an hour.  Keep  all follow-up prenatal visits as told by your health care provider. This is important.   Contact a health care provider if:  You have a fever.  You have continuous pain in your abdomen. Get help right away if:  Your contractions become stronger, more regular, and closer together.  You have fluid leaking or gushing from your vagina.  You pass blood-tinged mucus (bloody show).  You have bleeding from your vagina.  You have low back pain that you never had before.  You feel your baby's head pushing down and causing pelvic pressure.  Your baby is not moving inside you as much as it used to. Summary  Contractions that occur before labor are called Braxton Hicks contractions, false labor, or practice contractions.  Braxton Hicks contractions are usually shorter, weaker, farther apart, and less regular than true labor contractions. True labor contractions usually become progressively stronger and regular, and they become more frequent.  Manage discomfort from [facility] contractions by changing position, resting in a warm bath, drinking plenty of water, or practicing deep breathing. This information is not intended to replace advice given to you by your health care provider. Make sure you discuss any questions you have with your health care provider. Document Revised: 08/09/2017 Document Reviewed: 01/10/2017 Elsevier Patient Education  2021 Elsevier Inc.   AREA PEDIATRIC/FAMILY PRACTICE PHYSICIANS  [place] ([address]) . [facility] Melodie Bouillon, MD; Lum Babe, MD; Sheffield Slider, MD; Leveda Anna, MD; McDiarmid, MD; Jerene Bears, MD; Jennette Kettle, MD; Gwendolyn Grant, MD o [address], [place], [place] [address] o [phone] o Mon-Fri 8:30-12:30, 1:30-5:00 o Providers come to see babies at [facility] o Accepting Medicaid . [facility] o Limited providers who accept newborns: Docia Chuck, MD; Kateri Plummer, MD; Paulino Rily, MD o [address], [place], [place] [address] o [phone] o Mon-Fri 8:00-5:30 o Babies seen by providers at [facility] o Does NOT accept Medicaid o Please call early in hospitalization for appointment (limited availability)  . [facility] Fatima Sanger, MD o [address], [place], [place] [address] o [phone] o Mon, Tue, Thur, Fri 8:30-5:00, Wed 10:00-7:00 (closed 1-2pm) o Babies seen by [facility] providers o Accepting Medicaid . Donnie Coffin - Pediatrician Fae Pippin, MD o [address], [place], [place] [address] o [phone] o Mon-Fri 8:30-5:00, Sat 8:30-12:00 o Provider comes to see babies at [facility] o Accepting Medicaid o Must have been referred from current patients or contacted office prior to delivery . [facility]) Leotis Pain, MD; Ave Filter, MD; Luna Fuse, MD; Kennedy Bucker, MD; Konrad Dolores, MD; Kathlene November, MD; Jenne Campus, MD; Lubertha South, MD; Wynetta Emery, MD; Duffy Rhody, MD; Gerre Couch, NP; Shirl Harris, NP o [address] [place], [place], [place] [address] o [phone] o Mon, Tue, Thur, Fri 8:30-5:30, Wed 9:30-5:30, Sat 8:30-12:30 o Babies seen by [facility] providers o Accepting Medicaid o Only accepting infants of first-time parents or siblings of current patients [facility] discharge coordinator will make follow-up appointment . Cyril Mourning o [address], [place], [place]  [address] o [phone]   Fax - [phone] . [place] o [address], [place], [place]  [address] o Phone - [phone]   Fax - [phone] . Lucio Edward o [address], [place], [place]  [address] o [phone]  [place] [phone]) . [facility] Jorge Mandril, MD; Alita Chyle, MD; Princella Ion, MD; MD; Earlene Plater, MD; Jamesetta Orleans, MD; Alvera Novel, MD; Clarene Duke, MD; Rana Snare, MD; Carmon Ginsberg, MD; Alinda Money, MD; Hosie Poisson, MD; Mayford Knife, MD o [address], [place], [place]  [address] o [phone] o Mon-Fri 8:30-5:00 (extended evenings Mon-Thur as needed), Sat-Sun 10:00-1:00 o Providers come to see babies at [facility] o Accepting Medicaid for families of first-time babies and families with all children in the household age 27 and under. Must register with office prior to making appointment (M-F only). [facility] Odella Aquas, NP; Lynelle Doctor, MD; Susann Givens, MD; [place], [place] o [address], [place], [place] [address] o [phone] o Mon-Fri 8:00-5:00 o Babies seen by providers at [facility] o Does NOT accept Medicaid/Commercial Insurance Only . [facility] [place] [facility])  Suzette Battiest, MD; Zachery Dauer, MD; Stefan Church, MD; Sabino Dick, MD; Quitman Livings, MD; Farris Has, MD; Gaynell Face, MD; Betha Loa, MD; Colon Flattery, MD; Clifton James, MD o [address] [place] [place], [place] [address] o [phone] o Mon-Fri 8:30-5:30, Sat (Oct.-Mar.) 9:00-1:00 o Babies seen by providers at [facility] o Accepting [facility] [phone]) . [facility] Dimitri, MD; Sheliah Hatch, MD o [address], [place], [place] [address] o [phone] o Mon-Fri 8:30-5:00, Sat 8:30-12:00 o Providers come to see babies at [facility] o Does NOT accept Medicaid . [facility] Cindy Hazy, [place]; Tracie Harrier, MD; [place], [place]; Wynelle Link, MD; Azucena Cecil, MD o [address], [place], [place] [address] o [phone] o Mon-Fri 8:00-5:00 o Babies seen by providers at [facility] o  Does NOT accept Medicaid o Only accepting babies of parents who are patients o Please call early in hospitalization for appointment (limited availability) . [facility] Lamar Benes, MD; Abran Cantor, MD; Early Osmond, MD; Cherre Huger, NP; Hyacinth Meeker, MD; Dwan Bolt, MD; Jarold Motto, NP; Dario Guardian, MD; Talmage Nap, MD; Maisie Fus, MD; Pricilla Holm, MD; Tama High, MD o [address] [place], [place], [place] [address] o [phone] o Mon-Fri 8:00-5:00,  Sat 9:00-12:00 o Providers come to see babies at [facility] o Does NOT accept [facility] [phone]) . [facility] at [facility] o Limited providers accepting new patients: Drema Pry, NP; [place], [place] o [address], [place], [place] [address] o [phone] o Mon-Fri 8:00-5:00 o Babies seen by providers at [facility] o Does NOT accept Medicaid o Only accepting babies of parents who are patients o Please call early in hospitalization for appointment (limited availability) . [facility] Luan Pulling, MD; Nash Dimmer, MD o [address] [place], [place] [address] o [phone] (press 1 to schedule appointment) o Mon-Fri 8:00-5:00 o Providers come to see babies at [facility] o Does NOT accept Medicaid . [facility] Cristino Martes, MD o [address], [place], [place] [address] o [phone] o Mon-Fri 8:30-5:00 (lunch 12:30-1:00), extended hours by appointment only Wed 5:00-6:30 o Babies seen by [facility] providers o Accepting Medicaid . [facility] Abbot, MD; [place], MD; Hassan Rowan, MD o [address] [place], [place], [place] [address] o [phone] o Mon-Fri 8:00-5:00 o Babies seen by [facility] providers o Does NOT accept Medicaid . Nature conservation officer at [place] [address] Elsworth Soho, MD; Durene Cal, MD; [place], DO o [address] [place], [place] [address] o [phone] o Mon-Fri 8:00-5:00 o Babies seen by [facility] providers o Does NOT accept Medicaid . [facility] o [place], [place]; [place], [place]; [place], NP; Avis Epley, MD; Vonna Kotyk, MD; Clance Boll, MD; Stevphen [place], NP; Arvilla Market, NP; Ann Maki, NP; Otis Dials, NP; Vaughan Basta, MD; [place], MD o [address] [place], [place] [address] o [phone] o Mon-Fri 8:30-5:00, Sat 10:00-1:00 o Providers come to see babies at [facility] o Does NOT accept Medicaid o Free prenatal information session Tuesdays at 4:45pm . [facility] Luna Kitchens, MD; [place], [place]; [place], [place]; [place], [place] o [address] [place] [address] o [phone] o Mon-Fri 7:30-5:30 o Babies seen by [facility] providers . [facility] Doctor o [address], [place], [place]  [address] o [phone]   Fax - [phone]  [place] [phone] & [phone]) . [facility] Alphonsa Overall, MD o [address] [place], [place] [address] o [phone] o Mon-Thur 8:00-6:00 o Providers come to see babies at [facility] o Accepting Medicaid . [facility] Zenon Mayo, NP; Cyndia Bent, MD; [place], [place]; [place], [place] o [address] [place], [place] [address] o [phone] o Mon-Thur 7:30-7:30, Fri 7:30-4:30 o Babies seen by [facility] providers o Accepting Medicaid . [facility] Cheryle Horsfall, MD; Janene Harvey, NP; Vonita Moss, MD o [address], [place], [place] [address] o [phone] o Mon-Fri 8:30-5:00, Sat 8:30-12:00 o Providers come to see babies at [facility] o Accepting Medicaid o Must have "Meet & Greet" appointment at office prior to delivery . [facility] Llana Aliment, MD; Earlene Plater, MD; Lucretia Roers, MD o [address], [place], [place] [address] o [phone] o Mon-Wed 8:00-6:00, Thur-Fri 8:00-5:00, Sat 9:00-12:00 o Providers come to see babies at [facility] o  Does NOT accept Medicaid o Only accepting siblings of current patients . [facility] [place]  o [address], [place], [place]  [address] o [phone]   Fax - [phone] . [facility] at [facility] o [phone] [address], [place], [place]  [address] o [phone]   Fax - [phone]  [place] [phone] & [phone]) . Nature conservation officer at [facility] o Burg[place], DO; [place], DO o [address] [place], [place]  [address] o [phone] o Mon-Fri 7:00-5:00 o Babies seen by [facility] providers o Does NOT accept Medicaid . [facility] Ellis Savage, MD; [place], [place]; [place], [place] o [address], [place], [place] [address] o [phone] o Mon-Fri 8:00-5:00 o Babies seen by [facility] providers o Accepting Medicaid . [facility] - [address] Franne Forts, MD; [place], [place]; [place], NP; [place], [place] o [address] [place], [place], [place] [address] o [phone] o Mon-Fri 8:00-5:00 o Babies seen by providers at [facility] o Accepting [facility] [phone]) . [facility] [place], [place] o [address] [place], [place] [address] o [phone] o Mon-Fri 8:00-5:00 o Babies seen by [facility] providers o Does NOT accept Medicaid o Limited availability, please call early in hospitalization to schedule follow-up . [facility] Jolee Ewing, PA; Eddie Candle, MD; [place], MD; [place], [place]; Constance Goltz, MD; [place], [place] o [address] [facility] [address], [place], [place] [address] o [phone] o Mon-Fri 8:30-5:00, Sat 9:00-12:00 o Babies seen by providers at [facility] o Accepting Medicaid o Please register online then schedule online or call office o [email or web address] . [facility]) Samuella Bruin, NP; Lucianne Muss, MD; Lanier Clam, PA o [address], [place], [place] [address] o [phone] o Mon-Fri 8:00-5:00 o Babies seen by providers at [facility] o Accepting Medicaid . [facility] ([facility]) Sharin Mons, MD; Reed Breech, NP; Shelva Majestic, MD o [address], [place], [place] [address] o [phone] o Mon-Fri 8:00-5:30, Sat&Sun by appointment (phones open at 8:30) o Babies seen by [facility] providers o Accepting Medicaid o Must be a  first-time baby or sibling of current patient . [facility] [address], [place], [place]  [address] o [phone]   Fax - [phone]  [place] [phone] & [phone]) . [facility] Medicine o [place], [place]; [place], [place]; Dimple Casey, MD; [place], [place]; Carolyne Fiscal, MD o [address] [place], [place] [address] o [phone] o Mon-Thur 8:00-7:00, Fri 8:00-5:00, Sat 8:00-12:00, Sun 9:00-12:00 o Babies seen by [facility] providers o Accepting Medicaid . [facility] at [facility], MD; Gaynell Face, MD; [facility], MD o [address], [place], [place] [address] o [phone] o Mon-Thur 8:00-5:00 o Babies seen by providers at [facility] o Accepting Medicaid . [facility] [place] Gwenlyn Saran, MD; [place], MD; Madilyn Fireman, MD; Melvyn Neth, MD; List, MD; Lazarus Salines, MD; Gaynell Face, MD; Berneda Rose, MD; Flora Lipps, MD; Beryl Meager, MD; Luther Redo, MD; Lavonia Drafts, MD; Kellie Simmering, MD o [address] [place], [place] [address] o [phone] o Mon-Fri 8:00-5:30, Sat (Oct.-Mar.) 9:00-1:00 o Babies seen by providers at [facility] o Accepting Medicaid o Must fill out new patient packet, available online at [email or web address] . [facility] [place] [facility] [facility] at [facility]) Simone Curia, NP; Tiburcio Pea, NP; Tresa Endo, NP; Whitney Post, MD; [place], [place]; Hennie Duos, MD; Wynne Dust, MD; Kavin Leech, NP o [address], [place], [place] [address] o [phone] o Mon-Thur 8:00-5:30, Fri 8:00-5:00 o Babies seen by providers at [facility] o Accepting [facility] [phone]) . [facility] o [place], [place]; [place], MD; Tanya Nones, MD; [place], [place] o [address] [place], [place] [address] o [phone] o Mon-Fri 8:00-5:00 o Babies seen by providers at [facility] o Accepting [facility] [phone]) . [facility] at [facility] o [place], DO; Lenise Arena, MD; [place], [place] o [address], [place], [place] [address] o [phone] o Mon-Fri 8:00-5:00 o Babies seen by providers at [facility] o Does NOT accept Medicaid o Limited appointment availability, please call early in hospitalization  . Nature conservation officer at [facility] o [place], DO; [place], MD o [address], [place], [place] [address] o [phone] o Mon-Fri 8:00-5:00 o Babies seen by [facility] providers o Does NOT accept Medicaid . [facility] Lorrine Kin, MD; Ninetta Lights, MD; [place], [place]; [place], MD o [address], [place], [place] [address] o [phone] o Mon-Fri 8:00-5:00 o After hours clinic [facility] [place], [place] [address]) [phone] Mon-Fri 5:00-8:00, Sat 12:00-6:00, Sun 10:00-4:00 o Babies seen by [facility] providers o Accepting Medicaid . [facility] at [facility] o [address] [place], [place]  [address] o [phone]   Fax - [phone]  [place] [phone]) . Nature conservation officer at [facility], MD o [address] [place], [place], [place] [address] o [phone] o Mon-Fri 8:00-5:00 o Babies seen by [facility] providers o Does NOT accept Medicaid . [facility] [place] [facility] at [place]) Tomi Likens, MD o [address] [place] [address] [place], [place] [address] o [phone] o Mon-Thur 8:00-7:00, Fri 8:00-5:00, Sat 8:00-12:00 o Babies seen by providers at [facility] o Accepting Medicaid - but does not have vaccinations in office (must be received elsewhere) o Limited availability, please call early in hospitalization  [place] ([address]) . [facility]  o Wyvonne Lenz, MD o [address] [place] [address] o [phone]  Fax [phone]    Opciones de alimentos para pacientes adultos con enfermedad de reflujo gastroesofgico Food Choices for  Gastroesophageal Reflux Disease, Adult Si tiene enfermedad de reflujo gastroesofgico (ERGE), los alimentos que consume y los hbitos de alimentacin son Engineer, production. Elegir los alimentos adecuados puede ayudar a [employer]. Piense en consultar a un experto en alimentacin (nutricionista) para que lo ayude a Associate Professor. Consejos para seguir Consulting civil engineer las etiquetas de los alimentos  Elija alimentos que tengan bajo contenido de grasas saturadas. Los alimentos que pueden ayudar con los sntomas [employer] siguientes: ? Alimentos que tienen menos del 5% de los valores diarios (VD) de grasa. ? Alimentos que tienen 0 gramos de grasas trans. Al cocinar  No frer los alimentos.  Cocinar la comida al horno, al vapor, a la plancha o en la parrilla. Estos son mtodos que no necesitan mucha grasa para Water quality scientist.  Para agregar sabor, trate de consumir hierbas con bajo contenido de picante y [place]. Planificacin de las comidas  Elegir alimentos saludables con bajo contenido de [place], por ejemplo: ? Frutas y vegetales. ? Cereales integrales. ? Productos lcteos con bajo contenido  de grasa. ? Vita [place], pescado y aves.  Haga comidas pequeas frecuentes en lugar de 3 comidas abundantes al da. Coma lentamente, en un lugar donde est relajado. Evite agacharse o recostarse hasta 2 o 3horas despus de haber comido.  Limite los alimentos con alto contenido graso como las carnes grasas o los alimentos fritos.  Limite su ingesta de alimentos grasos, como aceites, [place] y [place].  Evite lo siguiente si el mdico se lo indica: ? Consumir alimentos que le ocasionen sntomas. Pueden ser distintos para cada persona. Lleve un registro de los alimentos para identificar aquellos que le causen sntomas. ? Alcohol. ? Beber mucha cantidad de lquido con las comidas. ? Comer 2 o 3 horas antes de acostarse.   Estilo de vida  Mantenga un peso saludable. Pregntele a su  mdico cul es el peso saludable para usted. Si necesita perder peso, hable con su mdico para hacerlo de manera segura.  Haga actividad fsica durante, al menos, 30 minutos 5 das por semana o ms, o como se lo haya indicado el mdico.  Use ropa suelta.  No fume ni consuma ningn producto que contenga nicotina o tabaco. Si necesita ayuda para dejar de fumar, consulte al mdico.  Duerma con la cabecera de la cama ms elevada que los pies. Use una cua debajo del colchn o bloques debajo del armazn de la cama para Pharmacologist la cabecera de la cama elevada.  Mastique chicle sin azcar despus de las comidas. Qu alimentos debo comer? Siga una dieta saludable y bien equilibrada que incluya abundantes frutas, verduras, cereales integrales, productos lcteos descremados, carnes magras, pescado y aves. Cada persona es diferente. Los alimentos que pueden causar sntomas en una persona pueden no causar sntomas en otra persona. Trabaje con el mdico para hallar alimentos que sean seguros para usted. Es posible que los productos que se enumeran ms [place] no constituyan una lista completa de lo que puede comer y Product manager. Pngase en contacto con un experto en alimentos para conocer ms opciones.   Qu alimentos debo evitar? Limitar algunos de estos alimentos puede ayudar a Chief Operating Officer los sntomas de [place]. Cada persona es diferente. Consulte a un experto en alimentacin o a su mdico para que lo ayude a Clinical research associate los alimentos exactos que debe evitar, si los hubiere. Frutas Cualquier fruta que est preparada con grasa agregada. Cualquier fruta que le ocasione sntomas. Para algunas personas, estas pueden incluir, las frutas ctricas como naranja, pomelo, pia y limn. Verduras Verduras fritas en abundante aceite. Papas fritas. Cualquier verdura que est preparada con grasa agregada. Cualquier verdura que le ocasione sntomas. Para algunas personas, estas pueden incluir tomates y productos con tomate, [place],  cebollas y [place], y rbanos picantes. Granos Pasteles o panes sin levadura con grasa agregada. Carnes y [address]tenas [address] de alto contenido graso como carne grasa de vaca o cerdo, salchichas, costillas, jamn, salchicha, salame y tocino. Carnes o protenas fritas, lo que incluye pescado frito y pollo frito. Frutos secos y Civil engineer, contracting de frutos secos, en grandes cantidades. Lcteos Leche entera y [place] con chocolate. Tami Ribas. Crema. Helado. Queso crema. Batidos con [employer]. Grasas y [employer]. Margarina. Lardo. Mantequilla clarificada. Bebidas Caf y t negro, con o sin cafena. Bebidas con gas. Refrescos. Bebidas energizantes. Jugo de fruta hecho con frutas cidas, como naranja o pomelo. Jugo de tomate. Bebidas alcohlicas. Dulces y postres Chocolate y cacao. Rosquillas. Alios y condimentos Pimienta. Menta y mentol. Sal agregada. Cualquier condimento, hierbas o aderezos que le ocasionen sntomas. Para algunas  personas, esto puede incluir curry, salsa picante o aderezos para ensalada a base de vinagre. Es posible que los productos que se enumeran ms arriba no constituyan una lista completa de lo que no debera comer y Product managerbeber. Pngase en contacto con un experto en alimentos para conocer ms opciones. Preguntas para hacerle al American Expressmdico Los cambios en la dieta y en el estilo de vida a menudo son los primeros pasos que se toman para Company secretarymanejar los sntomas de [place]GE. Si los Allied Waste Industriescambios en la dieta y el estilo de vida no ayudan, hable con el mdico sobre el uso de medicamentos. Dnde buscar ms informacin  Arts development officernternational Foundation for Gastrointestinal Disorders (Fundacin Internacional para los Trastornos Gastrointestinales): aboutgerd.org Resumen  Si tiene Hartford FinancialERGE, las elecciones de alimentos y el [place]ilo de vida son muy importantes para ayudar a Consulting civil engineeraliviar los sntomas.  Haga comidas pequeas durante Glass blower/designerel da en lugar de 3 comidas abundantes. Coma lentamente y en un lugar donde est  relajado.  Evite agacharse o recostarse hasta 2 o 3horas despus de haber comido.  Limite los alimentos con alto contenido graso como las carnes grasas o los alimentos fritos. Esta informacin no tiene Theme park managercomo fin reemplazar el consejo del mdico. Asegrese de hacerle al mdico cualquier pregunta que tenga. Document Revised: 04/08/2020 Document Reviewed: 04/08/2020 Elsevier Patient Education  2021 [employer]

## 2021-01-19 ENCOUNTER — Other Ambulatory Visit (HOSPITAL_COMMUNITY)
Admission: RE | Admit: 2021-01-19 | Discharge: 2021-01-19 | Disposition: A | Discharge status: Home / Self Care | Payer: Self-pay | Source: Ambulatory Visit | Attending: Certified Nurse Midwife | Admitting: Certified Nurse Midwife

## 2021-01-19 ENCOUNTER — Other Ambulatory Visit: Payer: Self-pay

## 2021-01-19 ENCOUNTER — Ambulatory Visit (INDEPENDENT_AMBULATORY_CARE_PROVIDER_SITE_OTHER): Payer: Self-pay | Admitting: Certified Nurse Midwife

## 2021-01-19 VITALS — BP 107/76 | HR 95 | Wt 205.2 lb

## 2021-01-19 DIAGNOSIS — Z3A31 31 weeks gestation of pregnancy: Secondary | ICD-10-CM

## 2021-01-19 DIAGNOSIS — R102 Pelvic and perineal pain: Secondary | ICD-10-CM

## 2021-01-19 DIAGNOSIS — Z3403 Encounter for supervision of normal first pregnancy, third trimester: Secondary | ICD-10-CM

## 2021-01-19 DIAGNOSIS — O26899 Other specified pregnancy related conditions, unspecified trimester: Secondary | ICD-10-CM

## 2021-01-19 NOTE — Progress Notes (Signed)
Patient states that she has been having lower abdominal and vaginal pain that started around 1 week ago. She rates pain about a 5. She also states that she feels her stomach getting tight about 3x a week. Denies any vaginal bleeding and feels baby move daily.

## 2021-01-19 NOTE — Progress Notes (Addendum)
   PRENATAL VISIT NOTE  Subjective:  Belinda May is a 20 y.o. G1P0000 at [redacted]w[redacted]d being seen today for ongoing prenatal care.  She is currently monitored for the following issues for this low-risk pregnancy and has PCOS (polycystic ovarian syndrome); Supervision of other normal pregnancy, antepartum; History of appendectomy; Gastritis; and Marginal insertion of umbilical cord affecting management of mother on their problem list.  Patient reports some cramping that has been going on for two weeks, sometimes lasts several hours, sometimes just a few minutes. Not coming and going in waves, seems unrelated to movement or IC. Increased discharge but no odor or irritation.  Contractions: Irritability. Vag. Bleeding: None.  Movement: Present. Denies leaking of fluid.   The following portions of the patient's history were reviewed and updated as appropriate: allergies, current medications, past family history, past medical history, past social history, past surgical history and problem list.   Spanish interpreter Gerre Couch) present for interpretation services  Objective:   Vitals:   01/19/21 0943  BP: 107/76  Pulse: 95  Weight: 205 lb 3.2 oz (93.1 kg)    Fetal Status: Fetal Heart Rate (bpm): 152 Fundal Height: 31 cm Movement: Present     General:  Alert, oriented and cooperative. Patient is in no acute distress.  Skin: Skin is warm and dry. No rash noted.   Cardiovascular: Normal heart rate noted  Respiratory: Normal respiratory effort, no problems with respiration noted  Abdomen: Soft, gravid, appropriate for gestational age.  Pain/Pressure: Present     Pelvic: Cervical exam deferred        Extremities: Normal range of motion.  Edema: None  Mental Status: Normal mood and affect. Normal behavior. Normal judgment and thought content.   Assessment and Plan:  Pregnancy: G1P0000 at [redacted]w[redacted]d 1. Supervision of low-risk first pregnancy, third trimester - Doing well other than cramping, feeling  vigorous fetal movement  2. [redacted] weeks gestation of pregnancy - Routine OB care, 3rd trmester labs all normal  3. Pelvic cramping in antepartum period - Cervicovaginal ancillary only  Preterm labor symptoms and general obstetric precautions including but not limited to vaginal bleeding, contractions, leaking of fluid and fetal movement were reviewed in detail with the patient. Please refer to After Visit Summary for other counseling recommendations.   Return in about 2 weeks (around 02/02/2021) for IN-PERSON, LOB.  Future Appointments  Date Time Provider Department Center  02/07/2021 10:55 AM [facility]  02/10/2021 10:30 AM [facility]  02/10/2021 10:45 AM [facility]4 [facility]    Bernerd Limbo, CNM

## 2021-01-20 LAB — CERVICOVAGINAL ANCILLARY ONLY
Bacterial Vaginitis (gardnerella): NEGATIVE
Candida Glabrata: NEGATIVE
Candida Vaginitis: NEGATIVE
Chlamydia: NEGATIVE
Comment: NEGATIVE
Comment: NEGATIVE
Comment: NEGATIVE
Comment: NEGATIVE
Comment: NEGATIVE
Comment: NORMAL
Neisseria Gonorrhea: NEGATIVE
Trichomonas: NEGATIVE

## 2021-02-07 ENCOUNTER — Other Ambulatory Visit: Payer: Self-pay

## 2021-02-07 ENCOUNTER — Ambulatory Visit (INDEPENDENT_AMBULATORY_CARE_PROVIDER_SITE_OTHER): Payer: Self-pay | Admitting: Certified Nurse Midwife

## 2021-02-07 VITALS — BP 105/72 | HR 92 | Wt 208.8 lb

## 2021-02-07 DIAGNOSIS — Z3A34 34 weeks gestation of pregnancy: Secondary | ICD-10-CM

## 2021-02-07 DIAGNOSIS — Z3403 Encounter for supervision of normal first pregnancy, third trimester: Secondary | ICD-10-CM

## 2021-02-07 NOTE — Progress Notes (Signed)
   PRENATAL VISIT NOTE  Subjective:  Belinda May is a 20 y.o. G1P0000 at [redacted]w[redacted]d being seen today for ongoing prenatal care.  She is currently monitored for the following issues for this low-risk pregnancy and has PCOS (polycystic ovarian syndrome); Supervision of other normal pregnancy, antepartum; History of appendectomy; Gastritis; and Marginal insertion of umbilical cord affecting management of mother on their problem list.  Patient reports no complaints.  Contractions: Irritability. Vag. Bleeding: None.  Movement: Present. Denies leaking of fluid.   The following portions of the patient's history were reviewed and updated as appropriate: allergies, current medications, past family history, past medical history, past social history, past surgical history and problem list.   Objective:   Vitals:   02/07/21 1151  BP: 105/72  Pulse: 92  Weight: 208 lb 12.8 oz (94.7 kg)    Fetal Status: Fetal Heart Rate (bpm): 142 Fundal Height: 34 cm Movement: Present     General:  Alert, oriented and cooperative. Patient is in no acute distress.  Skin: Skin is warm and dry. No rash noted.   Cardiovascular: Normal heart rate noted  Respiratory: Normal respiratory effort, no problems with respiration noted  Abdomen: Soft, gravid, appropriate for gestational age.  Pain/Pressure: Present     Pelvic: Cervical exam deferred        Extremities: Normal range of motion.  Edema: Trace  Mental Status: Normal mood and affect. Normal behavior. Normal judgment and thought content.   Assessment and Plan:  Pregnancy: G1P0000 at [redacted]w[redacted]d 1. Encounter for supervision of low-risk first pregnancy in third trimester - Doing well, feeling regular and vigorous fetal movement  2. [redacted] weeks gestation of pregnancy - Routine OB care - Anticipatory guidance re GBS test at next visit  Preterm labor symptoms and general obstetric precautions including but not limited to vaginal bleeding, contractions, leaking of  fluid and fetal movement were reviewed in detail with the patient. Please refer to After Visit Summary for other counseling recommendations.   Return in about 2 weeks (around 02/21/2021) for IN-PERSON, LOB w GBS.  Future Appointments  Date Time Provider Department Center  02/10/2021 10:30 AM [facility]URSE [facility]  02/10/2021 10:45 AM [facility] US4 [facility]  02/22/2021  9:15 AM Bernerd Limbo, CNM [facility]    Bernerd Limbo, CNM

## 2021-02-10 ENCOUNTER — Ambulatory Visit: Payer: Self-pay | Admitting: *Deleted

## 2021-02-10 ENCOUNTER — Other Ambulatory Visit: Payer: Self-pay

## 2021-02-10 ENCOUNTER — Ambulatory Visit: Payer: Self-pay | Attending: Maternal & Fetal Medicine

## 2021-02-10 ENCOUNTER — Encounter: Payer: Self-pay | Admitting: *Deleted

## 2021-02-10 ENCOUNTER — Other Ambulatory Visit: Payer: Self-pay | Admitting: *Deleted

## 2021-02-10 DIAGNOSIS — O43199 Other malformation of placenta, unspecified trimester: Secondary | ICD-10-CM

## 2021-02-10 DIAGNOSIS — O43193 Other malformation of placenta, third trimester: Secondary | ICD-10-CM | POA: Insufficient documentation

## 2021-02-10 DIAGNOSIS — Z3480 Encounter for supervision of other normal pregnancy, unspecified trimester: Secondary | ICD-10-CM | POA: Insufficient documentation

## 2021-02-10 DIAGNOSIS — Z6832 Body mass index (BMI) 32.0-32.9, adult: Secondary | ICD-10-CM

## 2021-02-10 DIAGNOSIS — O99213 Obesity complicating pregnancy, third trimester: Secondary | ICD-10-CM

## 2021-02-10 DIAGNOSIS — E669 Obesity, unspecified: Secondary | ICD-10-CM

## 2021-02-10 DIAGNOSIS — Z3A34 34 weeks gestation of pregnancy: Secondary | ICD-10-CM

## 2021-02-10 DIAGNOSIS — O321XX0 Maternal care for breech presentation, not applicable or unspecified: Secondary | ICD-10-CM

## 2021-02-22 ENCOUNTER — Other Ambulatory Visit: Payer: Self-pay

## 2021-02-22 ENCOUNTER — Ambulatory Visit (INDEPENDENT_AMBULATORY_CARE_PROVIDER_SITE_OTHER): Payer: Self-pay | Admitting: Certified Nurse Midwife

## 2021-02-22 VITALS — BP 113/76 | HR 96 | Wt 213.6 lb

## 2021-02-22 DIAGNOSIS — Z3A36 36 weeks gestation of pregnancy: Secondary | ICD-10-CM

## 2021-02-22 DIAGNOSIS — O368130 Decreased fetal movements, third trimester, not applicable or unspecified: Secondary | ICD-10-CM

## 2021-02-22 DIAGNOSIS — Z3403 Encounter for supervision of normal first pregnancy, third trimester: Secondary | ICD-10-CM

## 2021-02-22 NOTE — Progress Notes (Signed)
**  In-person Research officer, trade union utilized throughout the entire visit.  Eda, Spanish Interpreter at bedside for visit.    PRENATAL VISIT NOTE  Subjective:  Belinda May is a 20 y.o. G1P0000 at [redacted]w[redacted]d being seen today for ongoing prenatal care.  She is currently monitored for the following issues for this low-risk pregnancy and has PCOS (polycystic ovarian syndrome); Supervision of other normal pregnancy, antepartum; History of appendectomy; Gastritis; and Marginal insertion of umbilical cord affecting management of mother on their problem list.  Patient reports  that she is well without complaints .  Contractions: Irritability. Vag. Bleeding: None.   Movement: (!) Decreased. States that within the last few days, she has been having fewer movements than normal.   Denies leaking of fluid.   The following portions of the patient's history were reviewed and updated as appropriate: allergies, current medications, past family history, past medical history, past social history, past surgical history and problem list.   Objective:   Vitals:   02/22/21 0937  BP: 113/76  Pulse: 96  Weight: 96.9 kg    Fetal Status:     Movement: Present   General:  Alert, oriented and cooperative. Patient is in no acute distress.  Skin: Skin is warm and dry. No rash noted.   Cardiovascular: Normal heart rate noted  Respiratory: Normal respiratory effort, no problems with respiration noted  Abdomen: Soft, gravid, appropriate for gestational age.  Pain/Pressure: Present     Pelvic: Cervical exam deferred        Extremities: Normal range of motion.     Mental Status: Normal mood and affect. Normal behavior. Normal judgment and thought content.   Assessment and Plan:  Pregnancy: G1P0000 at [redacted]w[redacted]d 1. Encounter for supervision of low-risk first pregnancy in third trimester -She endorses good fetal movement.  - Culture, beta strep (group b only) collected today. Results pending .  - Vertex by Leopolds,  (+) FM.  2. Decreased fetal movements in third trimester, single or unspecified fetus - Fetal nonstress test today.  - NST Reactive and Reassuring. Baseline 140's with moderate variability. 15x15 accels present with no decels noted.   3. [redacted] weeks gestation of pregnancy -Pt feeling increased vaginal pressure, Discussed normal discomforts of pregnancy. Encouraged stretching for comfort.  - Culture, beta strep (group b only)  Preterm labor symptoms and general obstetric precautions including but not limited to vaginal bleeding, contractions, leaking of fluid and fetal movement were reviewed in detail with the patient. Please refer to After Visit Summary for other counseling recommendations.   Return in about 1 week (around 03/01/2021) for IN-PERSON, LOB.  Future Appointments  Date Time Provider Department Center  02/28/2021 10:55 AM Berle Mull [facility]  03/03/2021  2:30 PM [facility] NURSE [facility]  03/03/2021  2:45 PM [facility] US5 [facility]    Signed:  Rithy Mandley Danella Deis) Suzie Portela, BSN, RNC-OB  Student Nurse-Midwife   02/22/2021  12:10 PM

## 2021-02-26 LAB — CULTURE, BETA STREP (GROUP B ONLY): Strep Gp B Culture: NEGATIVE

## 2021-02-28 ENCOUNTER — Other Ambulatory Visit: Payer: Self-pay

## 2021-02-28 ENCOUNTER — Ambulatory Visit (INDEPENDENT_AMBULATORY_CARE_PROVIDER_SITE_OTHER): Payer: Self-pay

## 2021-02-28 VITALS — BP 107/74 | HR 112 | Wt 211.0 lb

## 2021-02-28 DIAGNOSIS — Z3A37 37 weeks gestation of pregnancy: Secondary | ICD-10-CM

## 2021-02-28 DIAGNOSIS — Z3403 Encounter for supervision of normal first pregnancy, third trimester: Secondary | ICD-10-CM

## 2021-02-28 NOTE — Progress Notes (Signed)
   PRENATAL VISIT NOTE  Subjective:  Belinda May is a 20 y.o. G1P0000 at [redacted]w[redacted]d being seen today for ongoing prenatal care.  She is currently monitored for the following issues for this low-risk pregnancy and has PCOS (polycystic ovarian syndrome); Supervision of other normal pregnancy, antepartum; History of appendectomy; Gastritis; and Marginal insertion of umbilical cord affecting management of mother on their problem list.  Patient reports no complaints.  Contractions: Irritability. Vag. Bleeding: None.  Movement: Present. Denies leaking of fluid.   The following portions of the patient's history were reviewed and updated as appropriate: allergies, current medications, past family history, past medical history, past social history, past surgical history and problem list.   Objective:   Vitals:   02/28/21 1325  BP: 107/74  Pulse: (!) 112  Weight: 211 lb (95.7 kg)    Fetal Status: Fetal Heart Rate (bpm): 152 Fundal Height: 40 cm Movement: Present     General:  Alert, oriented and cooperative. Patient is in no acute distress.  Skin: Skin is warm and dry. No rash noted.   Cardiovascular: Normal heart rate noted  Respiratory: Normal respiratory effort, no problems with respiration noted  Abdomen: Soft, gravid, appropriate for gestational age.  Pain/Pressure: Present     Pelvic: Cervical exam performed in the presence of a chaperone Dilation: Closed      Extremities: Normal range of motion.  Edema: Trace  Mental Status: Normal mood and affect. Normal behavior. Normal judgment and thought content.   Assessment and Plan:  Pregnancy: G1P0000 at [redacted]w[redacted]d  1. [redacted] weeks gestation of pregnancy - Doing well, no complaints - Anticipatory guidance for upcoming appointment provided  2. Supervision of low-risk first pregnancy, third trimester - Breech confirmed by BSUS - Has ultrasound scheduled on 6/24 - Will schedule to meet with MD next week  3. Language barrier - In person  Spanish interpretor used   Term labor symptoms and general obstetric precautions including but not limited to vaginal bleeding, contractions, leaking of fluid and fetal movement were reviewed in detail with the patient. Please refer to After Visit Summary for other counseling recommendations.   Return in about 1 week (around 03/07/2021).  Future Appointments  Date Time Provider Department Center  03/03/2021  2:30 PM [facility] NURSE [facility]  03/03/2021  2:45 PM [facility] US5 [facility]  03/10/2021  9:35 AM Crissie Reese, Mary Sella, MD [facility]    Brand Males, CNM 02/28/21 2:22 PM

## 2021-03-03 ENCOUNTER — Encounter: Payer: Self-pay | Admitting: *Deleted

## 2021-03-03 ENCOUNTER — Other Ambulatory Visit: Payer: Self-pay

## 2021-03-03 ENCOUNTER — Ambulatory Visit: Payer: Self-pay | Attending: Obstetrics and Gynecology

## 2021-03-03 ENCOUNTER — Ambulatory Visit: Payer: Self-pay | Admitting: *Deleted

## 2021-03-03 VITALS — BP 115/65 | HR 70

## 2021-03-03 DIAGNOSIS — Z3480 Encounter for supervision of other normal pregnancy, unspecified trimester: Secondary | ICD-10-CM

## 2021-03-03 DIAGNOSIS — O43199 Other malformation of placenta, unspecified trimester: Secondary | ICD-10-CM

## 2021-03-03 DIAGNOSIS — E669 Obesity, unspecified: Secondary | ICD-10-CM

## 2021-03-03 DIAGNOSIS — Z6832 Body mass index (BMI) 32.0-32.9, adult: Secondary | ICD-10-CM | POA: Insufficient documentation

## 2021-03-03 DIAGNOSIS — O321XX0 Maternal care for breech presentation, not applicable or unspecified: Secondary | ICD-10-CM

## 2021-03-03 DIAGNOSIS — O43193 Other malformation of placenta, third trimester: Secondary | ICD-10-CM

## 2021-03-03 DIAGNOSIS — Z3A37 37 weeks gestation of pregnancy: Secondary | ICD-10-CM

## 2021-03-03 DIAGNOSIS — O99213 Obesity complicating pregnancy, third trimester: Secondary | ICD-10-CM

## 2021-03-10 ENCOUNTER — Other Ambulatory Visit: Payer: Self-pay

## 2021-03-10 ENCOUNTER — Encounter (HOSPITAL_COMMUNITY): Payer: Self-pay

## 2021-03-10 ENCOUNTER — Encounter: Payer: Self-pay | Admitting: Family Medicine

## 2021-03-10 ENCOUNTER — Other Ambulatory Visit (HOSPITAL_COMMUNITY): Payer: Self-pay

## 2021-03-10 ENCOUNTER — Ambulatory Visit (INDEPENDENT_AMBULATORY_CARE_PROVIDER_SITE_OTHER): Payer: Self-pay | Admitting: Family Medicine

## 2021-03-10 ENCOUNTER — Encounter (HOSPITAL_COMMUNITY)
Admission: RE | Admit: 2021-03-10 | Discharge: 2021-03-10 | Disposition: A | Discharge status: Home / Self Care | Payer: Self-pay | Source: Ambulatory Visit | Attending: Family Medicine | Admitting: Family Medicine

## 2021-03-10 VITALS — BP 103/64 | HR 95 | Wt 216.9 lb

## 2021-03-10 DIAGNOSIS — O321XX0 Maternal care for breech presentation, not applicable or unspecified: Secondary | ICD-10-CM | POA: Insufficient documentation

## 2021-03-10 DIAGNOSIS — Z01812 Encounter for preprocedural laboratory examination: Secondary | ICD-10-CM | POA: Insufficient documentation

## 2021-03-10 DIAGNOSIS — Z3480 Encounter for supervision of other normal pregnancy, unspecified trimester: Secondary | ICD-10-CM

## 2021-03-10 DIAGNOSIS — O43199 Other malformation of placenta, unspecified trimester: Secondary | ICD-10-CM

## 2021-03-10 LAB — CBC
HCT: 40.7 % (ref 36.0–46.0)
Hemoglobin: 13.2 g/dL (ref 12.0–15.0)
MCH: 28.9 pg (ref 26.0–34.0)
MCHC: 32.4 g/dL (ref 30.0–36.0)
MCV: 89.3 fL (ref 80.0–100.0)
Platelets: 190 10*3/uL (ref 150–400)
RBC: 4.56 MIL/uL (ref 3.87–5.11)
RDW: 14.2 % (ref 11.5–15.5)
WBC: 9.6 10*3/uL (ref 4.0–10.5)
nRBC: 0 % (ref 0.0–0.2)

## 2021-03-10 LAB — COMPREHENSIVE METABOLIC PANEL
ALT: 13 U/L (ref 0–44)
AST: 22 U/L (ref 15–41)
Albumin: 3.2 g/dL — ABNORMAL LOW (ref 3.5–5.0)
Alkaline Phosphatase: 189 U/L — ABNORMAL HIGH (ref 38–126)
Anion gap: 11 (ref 5–15)
BUN: 9 mg/dL (ref 6–20)
CO2: 19 mmol/L — ABNORMAL LOW (ref 22–32)
Calcium: 9.5 mg/dL (ref 8.9–10.3)
Chloride: 105 mmol/L (ref 98–111)
Creatinine, Ser: 0.59 mg/dL (ref 0.44–1.00)
GFR, Estimated: >60 mL/min (ref >60)
Glucose, Bld: 80 mg/dL (ref 70–99)
Potassium: 4.1 mmol/L (ref 3.5–5.1)
Sodium: 135 mmol/L (ref 135–145)
Total Bilirubin: 0.8 mg/dL (ref 0.3–1.2)
Total Protein: 6.8 g/dL (ref 6.5–8.1)

## 2021-03-10 LAB — TYPE AND SCREEN
ABO/RH(D): O POS
Antibody Screen: NEGATIVE

## 2021-03-10 NOTE — Patient Instructions (Signed)
Eleccin del mtodo anticonceptivo Contraception Choices La anticoncepcin, o los mtodos anticonceptivos, hace referencia a los mtodos o dispositivos que evitan el embarazo. Mtodos hormonales Implante anticonceptivo Un implante anticonceptivo consiste en un tubo delgado de plstico que contiene una hormona que evita el embarazo. Es diferente de un dispositivo intrauterino (DIU). Un mdico lo inserta en la parte superior del brazo. Los implantes pueden ser eficaces durante un mximo de 3 aos. Inyecciones de progestina sola Las inyecciones de progestina sola contienen progestina, una forma sinttica de la hormona progesterona. Un mdico las administra cada 3 meses. Pldoras anticonceptivas Las pldoras anticonceptivas son pastillas que contienen hormonas que evitan el embarazo. Deben tomarse una vez al da, preferentemente a la misma hora cada da. Se necesita una receta para utilizar este mtodo anticonceptivo. Parche anticonceptivo El parche anticonceptivo contiene hormonas que evitan el embarazo. Se coloca en la piel, debe cambiarse una vez a la semana durante tres semanas y debe retirarse en la cuarta semana. Se necesita una receta para utilizar este mtodo anticonceptivo. Anillo vaginal Un anillo vaginal contiene hormonas que evitan el embarazo. Se coloca en la vagina durante tres semanas y se retira en la cuarta semana. Luego se repite el proceso con un anillo nuevo. Se necesita una receta para utilizar este mtodo anticonceptivo. Anticonceptivo de emergencia Los anticonceptivos de emergencia son mtodos para evitar un embarazo despus de tener sexo sin proteccin. Vienen en forma de pldora y pueden tomarse hasta 5 das despus de tener sexo. Funcionan mejor cuando se toman lo ms pronto posible luego de tener sexo. La mayora de los anticonceptivos de emergencia estn disponibles sin receta mdica. Este mtodo no debe utilizarse como el nico mtodo anticonceptivo. Mtodos de  barrera Condn masculino Un condn masculino es una vaina delgada que se coloca sobre el pene durante el sexo. Los condones evitan que el esperma ingrese en el cuerpo de la mujer. Pueden utilizarse con un una sustancia que mata a los espermatozoides (espermicida) para aumentar la efectividad. Deben desecharse despus de un uso. Condn femenino Un condn femenino es una vaina blanda y holgada que se coloca en la vagina antes de tener sexo. El condn evita que el esperma ingrese en el cuerpo de la mujer. Deben desecharse despus de un uso. Diafragma Un diafragma es una barrera blanda con forma de cpula. Se inserta en la vagina antes del sexo, junto con un espermicida. El diafragma bloquea el ingreso de esperma en el tero, y el espermicida mata a los espermatozoides. El diafragma debe permanecer en la vagina durante 6 a 8 horas despus de tener sexo y debe retirarse en el plazo de las 24 horas. Un diafragma es recetado y colocado por un mdico. Debe reemplazarse cada 1 a 2 aos, despus de dar a luz, de aumentar ms de 15lb (6.8kg) y de una ciruga plvica. Capuchn cervical Un capuchn cervical es una copa redonda y blanda de ltex o plstico que se coloca en el cuello uterino. Se inserta en la vagina antes del sexo, junto con un espermicida. Bloquea el ingreso del esperma en el tero. El capuchn debe permanecer en el lugar durante 6 a 8 horas despus de tener sexo y debe retirarse en el plazo de las 48 horas. Un capuchn cervical debe ser recetado y colocado por un mdico. Debe reemplazarse cada 2aos. Esponja Una esponja es una pieza blanda y circular de espuma de poliuretano que contiene espermicida. La esponja ayuda a bloquear el ingreso de esperma en el tero, y el espermicida mata a   los espermatozoides. Para utilizarla, debe humedecerla e insertarla en la vagina. Debe insertarse antes de tener sexo, debe permanecer dentro al menos durante 6 horas despus de tener sexo y debe retirarse y  desecharse en el plazo de las 30 horas. Espermicidas Los espermicidas son sustancias qumicas que matan o bloquean al esperma y no lo dejan ingresar al cuello uterino y al tero. Vienen en forma de crema, gel, supositorio, espuma o comprimido. Un espermicida debe insertarse en la vagina con un aplicador al menos 10 o 15 minutos antes de tener sexo para dar tiempo a que surta efecto. El proceso debe repetirse cada vez que tenga sexo. Los espermicidas no requieren receta mdica. Anticonceptivos intrauterinos Dispositivo intrauterino (DIU) Un DIU es un dispositivo en forma de T que se coloca en el tero. Existen dos tipos: DIU hormonal.Este tipo contiene progestina, una forma sinttica de la hormona progesterona. Este tipo puede permanecer colocado durante 3 a 5 aos. DIU de cobre.Este tipo est recubierto con un alambre de cobre. Puede permanecer colocado durante 10 aos. Mtodos anticonceptivos permanentes Ligadura de trompas en la mujer En este mtodo, se sellan, atan u obstruyen las trompas de Falopio durante una ciruga para evitar que el vulo descienda hacia el tero. Esterilizacin histeroscpica En este mtodo, se coloca un implante pequeo y flexible dentro de cada trompa de Falopio. Los implantes hacen que se forme un tejido cicatricial en las trompas de Falopio y que las obstruya para que el espermatozoide no pueda llegar al vulo. El procedimiento demora alrededor de 3 meses para que sea efectivo. Debe utilizarse otro mtodo anticonceptivo durante esos 3 meses. Esterilizacin masculina Este es un procedimiento que consiste en atar los conductos que transportan el esperma (vasectoma). Luego del procedimiento, el hombre puede eyacular lquido (semen). Debe utilizarse otro mtodo anticonceptivo durante 3 meses despus del procedimiento. Mtodos de planificacin natural Planificacin familiar natural En este mtodo, la pareja no tiene sexo durante los das en que la mujer podra quedar  embarazada. Mtodo calendario En este mtodo, la mujer realiza un seguimiento de la duracin de cada ciclo menstrual, identifica los das en los que se puede producir un embarazo y no tiene sexo durante esos das. Mtodo de la ovulacin En este mtodo, la pareja evita tener sexo durante la ovulacin. Mtodo sintotrmico Este mtodo implica no tener sexo durante la ovulacin. Normalmente, la mujer comprueba la ovulacin al observar cambios en su temperatura y en la consistencia del moco cervical. Mtodo posovulacin En este mtodo, la pareja espera a que finalice la ovulacin para tener sexo. Dnde buscar ms informacin Centers for Disease Control and Prevention (Centros para el Control y la Prevencin de Enfermedades): www.cdc.gov Resumen La anticoncepcin, o los mtodos anticonceptivos, hace referencia a los mtodos o dispositivos que evitan el embarazo. Los mtodos anticonceptivos hormonales incluyen implantes, inyecciones, pastillas, parches, anillos vaginales y anticonceptivos de emergencia. Los mtodos anticonceptivos de barrera pueden incluir condones masculinos, condones femeninos, diafragmas, capuchones cervicales, esponjas y espermicidas. Existen dos tipos de DIU (dispositivo intrauterino). Un DIU puede colocarse en el tero de una mujer para evitar el embarazo durante 3 a 5 aos. La esterilizacin permanente puede realizarse mediante un procedimiento tanto en los hombres como en las mujeres. Los mtodos de planificacin familiar natural implican no tener sexo durante los das en que la mujer podra quedar embarazada. Esta informacin no tiene como fin reemplazar el consejo del mdico. Asegrese de hacerle al mdico cualquier pregunta que tenga. Document Revised: 03/29/2020 Document Reviewed: 03/29/2020 Elsevier Patient Education  2022 Elsevier   Inc.  

## 2021-03-10 NOTE — Progress Notes (Signed)
   Subjective:  Belinda May is a 20 y.o. G1P0000 at [redacted]w[redacted]d being seen today for ongoing prenatal care.  She is currently monitored for the following issues for this low-risk pregnancy and has PCOS (polycystic ovarian syndrome); Supervision of other normal pregnancy, antepartum; History of appendectomy; Gastritis; and Marginal insertion of umbilical cord affecting management of mother on their problem list.  Patient reports no complaints.  Contractions: Irritability. Vag. Bleeding: None.  Movement: Present. Denies leaking of fluid.   The following portions of the patient's history were reviewed and updated as appropriate: allergies, current medications, past family history, past medical history, past social history, past surgical history and problem list. Problem list updated.  Objective:   Vitals:   03/10/21 0941  BP: 103/64  Pulse: 95  Weight: 216 lb 14.4 oz (98.4 kg)    Fetal Status: Fetal Heart Rate (bpm): 128   Movement: Present     General:  Alert, oriented and cooperative. Patient is in no acute distress.  Skin: Skin is warm and dry. No rash noted.   Cardiovascular: Normal heart rate noted  Respiratory: Normal respiratory effort, no problems with respiration noted  Abdomen: Soft, gravid, appropriate for gestational age. Pain/Pressure: Present     Pelvic: Vag. Bleeding: None     Cervical exam deferred        Extremities: Normal range of motion.     Mental Status: Normal mood and affect. Normal behavior. Normal judgment and thought content.   Urinalysis:      Assessment and Plan:  Pregnancy: G1P0000 at [redacted]w[redacted]d  1. Supervision of other normal pregnancy, antepartum BP and FHR normal Breech previously, today on [place] infant remains in breech presentation Counseled patient extensively on risks of both ECV and primary CS for breech She and her partner discussed at length and ultimately would like to attempt an ECV with the understanding that it is lower likelihood of success  with advanced gestational age Patient sent to [facility] for pre-op testing and scheduled for ECV on 03/12/21 at [redacted]w[redacted]d with plan for IOL if successful or pLTCS if not  2. Marginal insertion of umbilical cord affecting management of mother Normal growth scans  Term labor symptoms and general obstetric precautions including but not limited to vaginal bleeding, contractions, leaking of fluid and fetal movement were reviewed in detail with the patient. Please refer to After Visit Summary for other counseling recommendations.  Return in 1 week (on 03/17/2021) for [facility], ob visit.   Venora Maples, MD

## 2021-03-11 LAB — RPR
RPR Ser Ql: REACTIVE — AB
RPR Titer: 1:1 {titer}

## 2021-03-12 ENCOUNTER — Encounter (HOSPITAL_COMMUNITY): Payer: Self-pay | Admitting: Obstetrics & Gynecology

## 2021-03-12 ENCOUNTER — Inpatient Hospital Stay (HOSPITAL_COMMUNITY): Payer: Medicaid Other | Admitting: Certified Registered Nurse Anesthetist

## 2021-03-12 ENCOUNTER — Inpatient Hospital Stay (HOSPITAL_COMMUNITY): Payer: Self-pay | Attending: Family Medicine

## 2021-03-12 ENCOUNTER — Encounter (HOSPITAL_COMMUNITY)
Admission: AD | Disposition: A | Discharge status: Home / Self Care | Payer: Self-pay | Source: Home / Self Care | Attending: Obstetrics & Gynecology

## 2021-03-12 ENCOUNTER — Other Ambulatory Visit: Payer: Self-pay

## 2021-03-12 ENCOUNTER — Inpatient Hospital Stay (HOSPITAL_COMMUNITY): Admission: AD | Admit: 2021-03-12 | Payer: Self-pay | Source: Home / Self Care | Admitting: Family Medicine

## 2021-03-12 ENCOUNTER — Inpatient Hospital Stay (HOSPITAL_COMMUNITY)
Admission: AD | Admit: 2021-03-12 | Discharge: 2021-03-14 | DRG: 787 | Disposition: A | Discharge status: Home / Self Care | Payer: Medicaid Other | Attending: Obstetrics & Gynecology | Admitting: Obstetrics & Gynecology

## 2021-03-12 DIAGNOSIS — D62 Acute posthemorrhagic anemia: Secondary | ICD-10-CM | POA: Diagnosis not present

## 2021-03-12 DIAGNOSIS — O4292 Full-term premature rupture of membranes, unspecified as to length of time between rupture and onset of labor: Secondary | ICD-10-CM | POA: Diagnosis present

## 2021-03-12 DIAGNOSIS — O43199 Other malformation of placenta, unspecified trimester: Secondary | ICD-10-CM | POA: Diagnosis present

## 2021-03-12 DIAGNOSIS — O43123 Velamentous insertion of umbilical cord, third trimester: Secondary | ICD-10-CM | POA: Diagnosis present

## 2021-03-12 DIAGNOSIS — Z3A39 39 weeks gestation of pregnancy: Secondary | ICD-10-CM | POA: Diagnosis not present

## 2021-03-12 DIAGNOSIS — Z20822 Contact with and (suspected) exposure to covid-19: Secondary | ICD-10-CM | POA: Diagnosis present

## 2021-03-12 DIAGNOSIS — K2970 Gastritis, unspecified, without bleeding: Secondary | ICD-10-CM | POA: Diagnosis present

## 2021-03-12 DIAGNOSIS — O9962 Diseases of the digestive system complicating childbirth: Secondary | ICD-10-CM | POA: Diagnosis present

## 2021-03-12 DIAGNOSIS — O321XX0 Maternal care for breech presentation, not applicable or unspecified: Secondary | ICD-10-CM | POA: Diagnosis present

## 2021-03-12 DIAGNOSIS — Z98891 History of uterine scar from previous surgery: Secondary | ICD-10-CM

## 2021-03-12 DIAGNOSIS — O9081 Anemia of the puerperium: Secondary | ICD-10-CM | POA: Diagnosis not present

## 2021-03-12 DIAGNOSIS — O99214 Obesity complicating childbirth: Secondary | ICD-10-CM | POA: Diagnosis present

## 2021-03-12 LAB — RESP PANEL BY RT-PCR (FLU A&B, COVID) ARPGX2
Influenza A by PCR: NEGATIVE
Influenza B by PCR: NEGATIVE
SARS Coronavirus 2 by RT PCR: NEGATIVE

## 2021-03-12 SURGERY — Surgical Case
Anesthesia: Spinal | Wound class: Clean Contaminated

## 2021-03-12 MED ORDER — SODIUM CHLORIDE 0.9 % IV SOLN
500.0000 mg | Freq: Once | INTRAVENOUS | Status: AC
  Administered 2021-03-12: 500 mg via INTRAVENOUS
  Filled 2021-03-12 (×2): qty 500

## 2021-03-12 MED ORDER — IBUPROFEN 600 MG PO TABS
600.0000 mg | ORAL_TABLET | Freq: Four times a day (QID) | ORAL | Status: DC
  Administered 2021-03-13 – 2021-03-14 (×5): 600 mg via ORAL
  Filled 2021-03-12 (×5): qty 1

## 2021-03-12 MED ORDER — SCOPOLAMINE 1 MG/3DAYS TD PT72
MEDICATED_PATCH | TRANSDERMAL | Status: AC
  Filled 2021-03-12: qty 1

## 2021-03-12 MED ORDER — MORPHINE SULFATE (PF) 0.5 MG/ML IJ SOLN
INTRAMUSCULAR | Status: DC | PRN
  Administered 2021-03-12: 150 ug via INTRATHECAL

## 2021-03-12 MED ORDER — OXYTOCIN-SODIUM CHLORIDE 30-0.9 UT/500ML-% IV SOLN
INTRAVENOUS | Status: DC | PRN
  Administered 2021-03-12: 350 mL via INTRAVENOUS

## 2021-03-12 MED ORDER — OXYTOCIN-SODIUM CHLORIDE 30-0.9 UT/500ML-% IV SOLN
INTRAVENOUS | Status: AC
  Filled 2021-03-12: qty 500

## 2021-03-12 MED ORDER — SIMETHICONE 80 MG PO CHEW: 80.0000 mg | CHEWABLE_TABLET | ORAL | Status: DC | PRN

## 2021-03-12 MED ORDER — PHENYLEPHRINE HCL-NACL 20-0.9 MG/250ML-% IV SOLN
INTRAVENOUS | Status: DC | PRN
  Administered 2021-03-12: 60 ug/min via INTRAVENOUS

## 2021-03-12 MED ORDER — ZOLPIDEM TARTRATE 5 MG PO TABS: 5.0000 mg | ORAL_TABLET | Freq: Every evening | ORAL | Status: DC | PRN

## 2021-03-12 MED ORDER — FENTANYL CITRATE (PF) 100 MCG/2ML IJ SOLN
INTRAMUSCULAR | Status: AC
  Filled 2021-03-12: qty 2

## 2021-03-12 MED ORDER — HYDROMORPHONE HCL 1 MG/ML IJ SOLN: 1.0000 mg | INTRAMUSCULAR | Status: DC | PRN

## 2021-03-12 MED ORDER — PHENYLEPHRINE HCL-NACL 20-0.9 MG/250ML-% IV SOLN
INTRAVENOUS | Status: AC
  Filled 2021-03-12: qty 250

## 2021-03-12 MED ORDER — SCOPOLAMINE 1 MG/3DAYS TD PT72
1.0000 | MEDICATED_PATCH | Freq: Once | TRANSDERMAL | Status: DC
  Administered 2021-03-12: 1.5 mg via TRANSDERMAL

## 2021-03-12 MED ORDER — KETOROLAC TROMETHAMINE 30 MG/ML IJ SOLN
30.0000 mg | Freq: Four times a day (QID) | INTRAMUSCULAR | Status: AC
  Administered 2021-03-13 (×2): 30 mg via INTRAVENOUS
  Filled 2021-03-12 (×2): qty 1

## 2021-03-12 MED ORDER — NALBUPHINE HCL 10 MG/ML IJ SOLN
5.0000 mg | Freq: Once | INTRAMUSCULAR | Status: DC | PRN
Start: 2021-03-12 — End: 2021-03-14

## 2021-03-12 MED ORDER — SODIUM CHLORIDE 0.9% FLUSH: 3.0000 mL | INTRAVENOUS | Status: DC | PRN

## 2021-03-12 MED ORDER — MEPERIDINE HCL 25 MG/ML IJ SOLN: 6.2500 mg | INTRAMUSCULAR | Status: DC | PRN

## 2021-03-12 MED ORDER — NALBUPHINE HCL 10 MG/ML IJ SOLN: 5.0000 mg | Freq: Once | INTRAMUSCULAR | Status: DC | PRN

## 2021-03-12 MED ORDER — KETOROLAC TROMETHAMINE 30 MG/ML IJ SOLN
INTRAMUSCULAR | Status: AC
  Filled 2021-03-12: qty 1

## 2021-03-12 MED ORDER — KETOROLAC TROMETHAMINE 30 MG/ML IJ SOLN
30.0000 mg | Freq: Once | INTRAMUSCULAR | Status: AC | PRN
Start: 2021-03-12 — End: 2021-03-12
  Administered 2021-03-12: 30 mg via INTRAVENOUS

## 2021-03-12 MED ORDER — TETANUS-DIPHTH-ACELL PERTUSSIS 5-2.5-18.5 LF-MCG/0.5 IM SUSY: 0.5000 mL | PREFILLED_SYRINGE | Freq: Once | INTRAMUSCULAR | Status: DC

## 2021-03-12 MED ORDER — PROMETHAZINE HCL 25 MG/ML IJ SOLN
INTRAMUSCULAR | Status: AC
  Filled 2021-03-12: qty 1

## 2021-03-12 MED ORDER — ONDANSETRON HCL 4 MG/2ML IJ SOLN
INTRAMUSCULAR | Status: DC | PRN
  Administered 2021-03-12: 4 mg via INTRAVENOUS

## 2021-03-12 MED ORDER — PROMETHAZINE HCL 25 MG/ML IJ SOLN
6.2500 mg | INTRAMUSCULAR | Status: DC | PRN
  Administered 2021-03-12: 12.5 mg via INTRAVENOUS

## 2021-03-12 MED ORDER — DEXAMETHASONE SODIUM PHOSPHATE 4 MG/ML IJ SOLN
INTRAMUSCULAR | Status: DC | PRN
  Administered 2021-03-12: 4 mg via INTRAVENOUS

## 2021-03-12 MED ORDER — COCONUT OIL OIL
1.0000 "application " | TOPICAL_OIL | Status: DC | PRN
  Administered 2021-03-12: 1 via TOPICAL

## 2021-03-12 MED ORDER — ONDANSETRON HCL 4 MG/2ML IJ SOLN
INTRAMUSCULAR | Status: AC
  Filled 2021-03-12: qty 2

## 2021-03-12 MED ORDER — LIDOCAINE HCL (PF) 1 % IJ SOLN
INTRAMUSCULAR | Status: AC
  Filled 2021-03-12: qty 5

## 2021-03-12 MED ORDER — OXYTOCIN-SODIUM CHLORIDE 30-0.9 UT/500ML-% IV SOLN: 2.5000 [IU]/h | INTRAVENOUS | Status: AC

## 2021-03-12 MED ORDER — MAGNESIUM HYDROXIDE 400 MG/5ML PO SUSP: 30.0000 mL | ORAL | Status: DC | PRN

## 2021-03-12 MED ORDER — HYDROMORPHONE HCL 2 MG PO TABS
2.0000 mg | ORAL_TABLET | ORAL | Status: DC | PRN
Start: 2021-03-12 — End: 2021-03-12

## 2021-03-12 MED ORDER — OXYCODONE HCL 5 MG PO TABS
5.0000 mg | ORAL_TABLET | ORAL | Status: DC | PRN
  Administered 2021-03-13: 10 mg via ORAL
  Filled 2021-03-12: qty 2

## 2021-03-12 MED ORDER — HYDROMORPHONE HCL 1 MG/ML IJ SOLN
0.2500 mg | INTRAMUSCULAR | Status: DC | PRN
Start: 2021-03-12 — End: 2021-03-12

## 2021-03-12 MED ORDER — ENOXAPARIN SODIUM 40 MG/0.4ML IJ SOSY
40.0000 mg | PREFILLED_SYRINGE | INTRAMUSCULAR | Status: DC
  Administered 2021-03-13 – 2021-03-14 (×2): 40 mg via SUBCUTANEOUS
  Filled 2021-03-12 (×2): qty 0.4

## 2021-03-12 MED ORDER — LACTATED RINGERS IV SOLN: INTRAVENOUS | Status: DC | PRN

## 2021-03-12 MED ORDER — FERROUS SULFATE 325 (65 FE) MG PO TABS
325.0000 mg | ORAL_TABLET | ORAL | Status: DC
  Administered 2021-03-13: 325 mg via ORAL
  Filled 2021-03-12: qty 1

## 2021-03-12 MED ORDER — DEXAMETHASONE SODIUM PHOSPHATE 4 MG/ML IJ SOLN
INTRAMUSCULAR | Status: AC
  Filled 2021-03-12: qty 1

## 2021-03-12 MED ORDER — SOD CITRATE-CITRIC ACID 500-334 MG/5ML PO SOLN
30.0000 mL | ORAL | Status: AC
  Administered 2021-03-12: 30 mL via ORAL
  Filled 2021-03-12: qty 30

## 2021-03-12 MED ORDER — DIPHENHYDRAMINE HCL 25 MG PO CAPS: 25.0000 mg | ORAL_CAPSULE | ORAL | Status: DC | PRN

## 2021-03-12 MED ORDER — LACTATED RINGERS IV SOLN: INTRAVENOUS | Status: DC

## 2021-03-12 MED ORDER — WITCH HAZEL-GLYCERIN EX PADS: 1.0000 "application " | MEDICATED_PAD | CUTANEOUS | Status: DC | PRN

## 2021-03-12 MED ORDER — NALOXONE HCL 4 MG/10ML IJ SOLN
1.0000 ug/kg/h | INTRAVENOUS | Status: DC | PRN
  Filled 2021-03-12: qty 5

## 2021-03-12 MED ORDER — DIBUCAINE (PERIANAL) 1 % EX OINT: 1.0000 "application " | TOPICAL_OINTMENT | CUTANEOUS | Status: DC | PRN

## 2021-03-12 MED ORDER — SENNOSIDES-DOCUSATE SODIUM 8.6-50 MG PO TABS
2.0000 | ORAL_TABLET | Freq: Every day | ORAL | Status: DC
  Administered 2021-03-13 – 2021-03-14 (×2): 2 via ORAL
  Filled 2021-03-12 (×2): qty 2

## 2021-03-12 MED ORDER — MEPERIDINE HCL 25 MG/ML IJ SOLN
6.2500 mg | INTRAMUSCULAR | Status: DC | PRN
Start: 2021-03-12 — End: 2021-03-12

## 2021-03-12 MED ORDER — MEASLES, MUMPS & RUBELLA VAC IJ SOLR: 0.5000 mL | Freq: Once | INTRAMUSCULAR | Status: DC

## 2021-03-12 MED ORDER — DIPHENHYDRAMINE HCL 25 MG PO CAPS: 25.0000 mg | ORAL_CAPSULE | Freq: Four times a day (QID) | ORAL | Status: DC | PRN

## 2021-03-12 MED ORDER — MENTHOL 3 MG MT LOZG: 1.0000 | LOZENGE | OROMUCOSAL | Status: DC | PRN

## 2021-03-12 MED ORDER — NALBUPHINE HCL 10 MG/ML IJ SOLN: 5.0000 mg | INTRAMUSCULAR | Status: DC | PRN

## 2021-03-12 MED ORDER — ONDANSETRON HCL 4 MG/2ML IJ SOLN
4.0000 mg | Freq: Three times a day (TID) | INTRAMUSCULAR | Status: DC | PRN
  Administered 2021-03-12: 4 mg via INTRAVENOUS

## 2021-03-12 MED ORDER — MORPHINE SULFATE (PF) 0.5 MG/ML IJ SOLN
INTRAMUSCULAR | Status: AC
  Filled 2021-03-12: qty 10

## 2021-03-12 MED ORDER — PRENATAL MULTIVITAMIN CH
1.0000 | ORAL_TABLET | Freq: Every day | ORAL | Status: DC
  Administered 2021-03-13 – 2021-03-14 (×2): 1 via ORAL
  Filled 2021-03-12 (×2): qty 1

## 2021-03-12 MED ORDER — BUPIVACAINE IN DEXTROSE 0.75-8.25 % IT SOLN
INTRATHECAL | Status: DC | PRN
  Administered 2021-03-12: 1.5 mL via INTRATHECAL

## 2021-03-12 MED ORDER — NALOXONE HCL 0.4 MG/ML IJ SOLN: 0.4000 mg | INTRAMUSCULAR | Status: DC | PRN

## 2021-03-12 MED ORDER — SODIUM CHLORIDE 0.9 % IV SOLN
2.0000 g | INTRAVENOUS | Status: AC
  Administered 2021-03-12: 2 g via INTRAVENOUS
  Filled 2021-03-12 (×2): qty 2

## 2021-03-12 MED ORDER — DIPHENHYDRAMINE HCL 50 MG/ML IJ SOLN: 12.5000 mg | INTRAMUSCULAR | Status: DC | PRN

## 2021-03-12 MED ORDER — TRAMADOL HCL 50 MG PO TABS: 50.0000 mg | ORAL_TABLET | Freq: Four times a day (QID) | ORAL | Status: DC | PRN

## 2021-03-12 MED ORDER — GABAPENTIN 100 MG PO CAPS
300.0000 mg | ORAL_CAPSULE | Freq: Two times a day (BID) | ORAL | Status: DC
  Administered 2021-03-13 – 2021-03-14 (×4): 300 mg via ORAL
  Filled 2021-03-12 (×4): qty 3

## 2021-03-12 MED ORDER — SODIUM CHLORIDE 0.9 % IR SOLN
Status: DC | PRN
  Administered 2021-03-12: 1000 mL

## 2021-03-12 MED ORDER — FENTANYL CITRATE (PF) 100 MCG/2ML IJ SOLN
INTRAMUSCULAR | Status: DC | PRN
  Administered 2021-03-12: 15 ug via INTRATHECAL

## 2021-03-12 SURGICAL SUPPLY — 33 items
CHLORAPREP W/TINT 26ML (MISCELLANEOUS) ×2 IMPLANT
CLAMP CORD UMBIL (MISCELLANEOUS) IMPLANT
CLOTH BEACON ORANGE TIMEOUT ST (SAFETY) ×2 IMPLANT
DRSG OPSITE POSTOP 4X10 (GAUZE/BANDAGES/DRESSINGS) ×2 IMPLANT
ELECT REM PT RETURN 9FT ADLT (ELECTROSURGICAL) ×2
ELECTRODE REM PT RTRN 9FT ADLT (ELECTROSURGICAL) ×1 IMPLANT
EXTRACTOR VACUUM M CUP 4 TUBE (SUCTIONS) IMPLANT
GAUZE SPONGE 4X4 8PLY STR LF (GAUZE/BANDAGES/DRESSINGS) ×4 IMPLANT
GLOVE BIOGEL PI IND STRL 7.0 (GLOVE) ×3 IMPLANT
GLOVE BIOGEL PI INDICATOR 7.0 (GLOVE) ×3
GLOVE ECLIPSE 7.0 STRL STRAW (GLOVE) ×2 IMPLANT
GOWN STRL REUS W/TWL LRG LVL3 (GOWN DISPOSABLE) ×4 IMPLANT
KIT ABG SYR 3ML LUER SLIP (SYRINGE) IMPLANT
NEEDLE HYPO 22GX1.5 SAFETY (NEEDLE) ×2 IMPLANT
NEEDLE HYPO 25X5/8 SAFETYGLIDE (NEEDLE) ×2 IMPLANT
NS IRRIG 1000ML POUR BTL (IV SOLUTION) ×2 IMPLANT
PACK C SECTION WH (CUSTOM PROCEDURE TRAY) ×2 IMPLANT
PAD ABD 7.5X8 STRL (GAUZE/BANDAGES/DRESSINGS) ×2 IMPLANT
PAD ABD 8X10 STRL (GAUZE/BANDAGES/DRESSINGS) ×2 IMPLANT
PAD OB MATERNITY 4.3X12.25 (PERSONAL CARE ITEMS) ×2 IMPLANT
PENCIL SMOKE EVAC W/HOLSTER (ELECTROSURGICAL) ×2 IMPLANT
RTRCTR C-SECT PINK 25CM LRG (MISCELLANEOUS) IMPLANT
SUT PDS AB 0 CTX 36 PDP370T (SUTURE) IMPLANT
SUT PLAIN 2 0 XLH (SUTURE) IMPLANT
SUT VIC AB 0 CTX 36 (SUTURE) ×2
SUT VIC AB 0 CTX36XBRD ANBCTRL (SUTURE) ×2 IMPLANT
SUT VIC AB 2-0 CT1 (SUTURE) ×2 IMPLANT
SUT VIC AB 4-0 KS 27 (SUTURE) ×2 IMPLANT
SYR CONTROL 10ML LL (SYRINGE) ×2 IMPLANT
TAPE MEDIFIX FOAM 3 (GAUZE/BANDAGES/DRESSINGS) ×2 IMPLANT
TOWEL OR 17X24 6PK STRL BLUE (TOWEL DISPOSABLE) ×2 IMPLANT
TRAY FOLEY W/BAG SLVR 14FR LF (SET/KITS/TRAYS/PACK) ×2 IMPLANT
WATER STERILE IRR 1000ML POUR (IV SOLUTION) ×2 IMPLANT

## 2021-03-12 NOTE — Transfer of Care (Signed)
Immediate Anesthesia Transfer of Care Note  Patient: Belinda May  Procedure(s) Performed: CESAREAN SECTION  Patient Location: PACU  Anesthesia Type:Spinal  Level of Consciousness: awake, alert  and patient cooperative  Airway & Oxygen Therapy: Patient Spontanous Breathing  Post-op Assessment: Report given to RN and Post -op Vital signs reviewed and stable  Post vital signs: Reviewed and stable  Last Vitals:  Vitals Value Taken Time  BP 102/72 03/12/21 0650  Temp 35.9 C 03/12/21 0650  Pulse 86 03/12/21 0652  Resp 16 03/12/21 0652  SpO2 99 % 03/12/21 0652  Vitals shown include unvalidated device data.  Last Pain:  Vitals:   03/12/21 0650  TempSrc: Axillary         Complications: No notable events documented.

## 2021-03-12 NOTE — Lactation Note (Signed)
This note was copied from a baby's chart. Lactation Consultation Note  Patient Name: Boy Loyalty Arentz [id number] Date: 03/12/2021 Reason for consult: Initial assessment;Term;Primapara;1st time breastfeeding Age:21 hours   P1 mother whose infant is now 57 hours old.  This is a term baby at 39+0 weeks.  Mother's feeding preference is breast/formula.  Baby has already received formula after birth due to mother's request.  Encouraged mother to breast feed prior to giving any formula supplementation.  Provided supplementation guidelines and discussed infant's stomach size. Reviewed breast feeding basics with family.  Suggested mother call back for the next feeding and I would be happy to assist with latching.  Mother is having a hard time staying awake.  Will continue to review during next visit.  Mom made aware of O/P services, breastfeeding support groups, community resources, and our phone # for post-discharge questions.  Mom made aware of O/P services, breastfeeding support groups, community resources, and our phone # for post-discharge questions.  Father and grandmother present.    Maternal Data Has patient been taught Hand Expression?: Yes Does the patient have breastfeeding experience prior to this delivery?: No  Feeding Mother's Current Feeding Choice: Breast Milk and Formula  LATCH Score Latch: Repeated attempts needed to sustain latch, nipple held in mouth throughout feeding, stimulation needed to elicit sucking reflex.  Audible Swallowing: None  Type of Nipple: Flat  Comfort (Breast/Nipple): Soft / non-tender  Hold (Positioning): Full assist, staff holds infant at breast  LATCH Score: 4   Lactation Tools Discussed/Used    Interventions    Discharge WIC Program: No (Father reported someone from [facility] has informed them that they will contact WIC for them)  Consult Status Consult Status: Follow-up Date: 03/13/21 Follow-up type: In-patient    Rielynn Trulson  R Rodgerick Gilliand 03/12/2021, 10:20 AM

## 2021-03-12 NOTE — Anesthesia Procedure Notes (Signed)
Spinal  Patient location during procedure: OR Start time: 03/12/2021 5:23 AM End time: 03/12/2021 5:39 AM Reason for block: surgical anesthesia Staffing Performed: anesthesiologist  Anesthesiologist: Nolon Nations, MD Preanesthetic Checklist Completed: patient identified, IV checked, site marked, risks and benefits discussed, surgical consent, monitors and equipment checked, pre-op evaluation and timeout performed Spinal Block Patient position: sitting Prep: DuraPrep and site prepped and draped Patient monitoring: heart rate, continuous pulse ox and blood pressure Approach: midline Location: L3-4 Injection technique: single-shot Needle Needle type: Spinocan  Needle gauge: 25 G Needle length: 9 cm Additional Notes Expiration date of kit checked and confirmed. Patient tolerated procedure well, without complications.

## 2021-03-12 NOTE — Anesthesia Procedure Notes (Signed)
Spinal  Patient location during procedure: OR Reason for block: surgical anesthesia Staffing Performed: anesthesiologist  Anesthesiologist: Nolon Nations, MD Preanesthetic Checklist Completed: patient identified, IV checked, site marked, risks and benefits discussed, surgical consent, monitors and equipment checked, pre-op evaluation and timeout performed Spinal Block Prep: DuraPrep and site prepped and draped Patient monitoring: heart rate, continuous pulse ox and blood pressure Injection technique: single-shot Needle Needle type: Spinocan  Needle gauge: 25 G Needle length: 9 cm Additional Notes Expiration date of kit checked and confirmed. Patient tolerated procedure well, without complications.

## 2021-03-12 NOTE — Anesthesia Preprocedure Evaluation (Addendum)
Anesthesia Evaluation  Patient identified by MRN, date of birth, ID band Patient awake    Reviewed: Allergy & Precautions, NPO status , Patient's Chart, lab work & pertinent test results  Airway Mallampati: II  TM Distance: >3 FB Neck ROM: Full    Dental no notable dental hx. (+) Dental Advisory Given   Pulmonary neg pulmonary ROS,    Pulmonary exam normal breath sounds clear to auscultation       Cardiovascular negative cardio ROS Normal cardiovascular exam Rhythm:Regular Rate:Normal     Neuro/Psych negative neurological ROS     GI/Hepatic negative GI ROS, Neg liver ROS,   Endo/Other  Morbid obesity  Renal/GU negative Renal ROS     Musculoskeletal negative musculoskeletal ROS (+)   Abdominal (+) + obese,   Peds  Hematology negative hematology ROS (+)   Anesthesia Other Findings   Reproductive/Obstetrics (+) Pregnancy                            Anesthesia Physical Anesthesia Plan  ASA: 3  Anesthesia Plan: Spinal   Post-op Pain Management:    Induction:   PONV Risk Score and Plan: 4 or greater and Ondansetron, Dexamethasone, Treatment may vary due to age or medical condition and Scopolamine patch - Pre-op  Airway Management Planned: Natural Airway  Additional Equipment:   Intra-op Plan:   Post-operative Plan:   Informed Consent: I have reviewed the patients History and Physical, chart, labs and discussed the procedure including the risks, benefits and alternatives for the proposed anesthesia with the patient or authorized representative who has indicated his/her understanding and acceptance.     Dental advisory given  Plan Discussed with: CRNA  Anesthesia Plan Comments:        Anesthesia Quick Evaluation

## 2021-03-12 NOTE — Op Note (Signed)
Oscar Forman PROCEDURE DATE: 03/12/2021  PREOPERATIVE DIAGNOSES: Intrauterine pregnancy at [redacted]w[redacted]d weeks gestation; malpresentation: breech; premature rupture of membranes at term  POSTOPERATIVE DIAGNOSES: The same  PROCEDURE: Primary Low Transverse Cesarean Section  SURGEON:  Dr. Jaynie Collins  ASSISTANT:  Dr. Lynnda Shields  ANESTHESIOLOGY TEAM: Anesthesiologist: Lewie Loron, MD CRNA: Trellis Paganini, CRNA  INDICATIONS: Belinda May is a 20 y.o. G1P1001 at [redacted]w[redacted]d here for cesarean section secondary to the indications listed under preoperative diagnoses; please see preoperative note for further details.  The risks of surgery were discussed with the patient including but were not limited to: bleeding which may require transfusion or reoperation; infection which may require antibiotics; injury to bowel, bladder, ureters or other surrounding organs; injury to the fetus; need for additional procedures including hysterectomy in the event of a life-threatening hemorrhage; formation of adhesions; placental abnormalities wth subsequent pregnancies; incisional problems; thromboembolic phenomenon and other postoperative/anesthesia complications.  The patient concurred with the proposed plan, giving informed written consent for the procedure.    FINDINGS:  Viable female infant in breech presentation.  Apgars 9 and 9.  Clear amniotic fluid.  Intact placenta, three vessel cord.  Normal uterus, fallopian tubes and ovaries bilaterally.  ANESTHESIA: Spinal ESTIMATED BLOOD LOSS: 450 ml SPECIMENS: Placenta sent to L&D COMPLICATIONS: None immediate  PROCEDURE IN DETAIL:  The patient preoperatively received intravenous antibiotics and had sequential compression devices applied to her lower extremities.  She was then taken to the operating room where spinal anesthesia was administered and was found to be adequate. She was then placed in a dorsal supine position with a leftward tilt, and  prepped and draped in a sterile manner.  A foley catheter was placed into her bladder and attached to constant gravity.  After an adequate timeout was performed, a Pfannenstiel skin incision was made with scalpel and carried through to the underlying layer of fascia. The fascia was incised in the midline, and this incision was extended bilaterally using the Mayo scissors.  Kocher clamps were applied to the superior aspect of the fascial incision and the underlying rectus muscles were dissected off bluntly and sharply.  A similar process was carried out on the inferior aspect of the fascial incision. The rectus muscles were separated in the midline and the peritoneum was entered bluntly. The Alexis self-retaining retractor was introduced into the abdominal cavity.  Attention was turned to the lower uterine segment where a low transverse hysterotomy was made with a scalpel and extended bilaterally bluntly.  The infant was successfully delivered, the cord was clamped and cut after one minute, and the infant was handed over to the awaiting neonatology team. Uterine massage was then administered, and the placenta delivered intact with a three-vessel cord. The uterus was then cleared of clots and debris.  The hysterotomy was closed with 0 Vicryl in a running locked fashion, and an imbricating layer was also placed with 0 Vicryl..  The pelvis was cleared of all clot and debris. Hemostasis was confirmed on all surfaces.  The retractor was removed.  The peritoneum was closed with a 0 Vicryl running stitch. The fascia was then closed using 0 Vicryl in a running fashion.  The subcutaneous layer was irrigated, reapproximated with 2-0 plain gut interrupted stitches, and the skin was closed with a 4-0 Vicryl subcuticular stitch. The patient tolerated the procedure well. Sponge, instrument and needle counts were correct x 3.  She was taken to the recovery room in stable condition.    Jaynie Collins, MD,  FACOG Obstetrician &  Gynecologist, Biochemist, clinical for [employer], [facility]

## 2021-03-12 NOTE — MAU Provider Note (Addendum)
Event Date/Time   First Provider Initiated Contact with Patient 03/12/21 0250      S: Ms. Belinda May is a 20 y.o. G1P0000 at [redacted]w[redacted]d  who presents to MAU today complaining LOF since 0130 today. She denies contractions or vaginal bleeding. She reports normal fetal movement.    O: LMP 06/12/2020  GENERAL: Well-developed, well-nourished female in no acute distress.  HEAD: Normocephalic, atraumatic.  CHEST: Normal effort of breathing, regular heart rate ABDOMEN: Soft, nontender, gravid  Cervical exam:  Dilation: 1 Effacement (%): 60 Cervical Position: Posterior Station: -2 Exam by:: San Jetty, RN   Fetal Monitoring: Baseline: 135 bpm Variability: moderate Accelerations: 15 x 15 Decelerations: None Contractions: None  Bedside US Pt informed that the ultrasound is considered a limited OB ultrasound and is not intended to be a complete ultrasound exam.  Patient also informed that the ultrasound is not being completed with the intent of assessing for fetal or placental anomalies or any pelvic abnormalities.  Explained that the purpose of today's ultrasound is to assess for  presentation.  Patient acknowledges the purpose of the exam and the limitations of the study. Breech presentation confirmed.   A: SIUP at [redacted]w[redacted]d  PROM Breech presentation   P: Dr. Macon Large at bedside to discuss C/S  Marny Lowenstein, PA-C 03/12/2021 2:51 AM

## 2021-03-12 NOTE — H&P (Signed)
OBSTETRIC ADMISSION HISTORY AND PHYSICAL  Ivanell Deshotel is a 20 y.o. female G1P0000 with IUP at 69w0dby LMP [place]. Known breech fetal presentation, was scheduled for ECV later today. She reports +FMs, No LOF, no VB, no blurry vision, headaches or peripheral edema, and RUQ pain.  She plans on breast feeding. She plans on POPs for birth control. [facility]    Dating: By LMP --->  Estimated Date of Delivery: 03/19/21  Sono:  _0 , CWD, normal anatomy, breech presentation, 3097g, 42% EFW  Prenatal History/Complications:  - Gastritis (needs H. Pylori testing s/p delivery) - Breech Fetal Presentation (confirmed on admission) - Marginal cord insertion  Past Medical History: Past Medical History:  Diagnosis Date   PCOS (polycystic ovarian syndrome)     Past Surgical History: Past Surgical History:  Procedure Laterality Date   APPENDECTOMY  2012   Pt reports 2012 or 2013    Obstetrical History: OB History     Gravida  1   Para  0   Term  0   Preterm  0   AB  0   Living  0      SAB  0   IAB  0   Ectopic  0   Multiple  0   Live Births  0           Social History Social History   Socioeconomic History   Marital status: Single    Spouse name: Not on file   Number of children: Not on file   Years of education: Not on file   Highest education level: Not on file  Occupational History   Not on file  Tobacco Use   Smoking status: Never   Smokeless tobacco: Never  Vaping Use   Vaping Use: Never used  Substance and Sexual Activity   Alcohol use: Never   Drug use: Never   Sexual activity: Yes    Partners: Male    Birth control/protection: Pill, None    Comment: OCPs prior to pregnancy  Other Topics Concern   Not on file  Social History Narrative   Not on file   Social Determinants of Health   Financial Resource Strain: Not on file  Food Insecurity: No Food Insecurity   Worried  About Running Out of Food in the Last Year: Never true   Ran Out of Food in the Last Year: Never true  Transportation Needs: No Transportation Needs   Lack of Transportation (Medical): No   Lack of Transportation (Non-Medical): No  Physical Activity: Not on file  Stress: Not on file  Social Connections: Not on file    Family History: Family History  Problem Relation Age of Onset   Uterine cancer Mother    Diabetes Maternal Grandmother    Diabetes Paternal Grandmother     Allergies: Allergies  Allergen Reactions   Tylenol [Acetaminophen] Itching    Itching, burning and redness around mouth     Medications Prior to Admission  Medication Sig Dispense Refill Last Dose   Prenatal Vit-Fe Fumarate-FA (PRENATAL VITAMIN PO) Take by mouth.   03/11/2021   cyclobenzaprine (FLEXERIL) 10 MG tablet Take 1 tablet (10 mg total) by mouth every 8 (eight) hours as needed for muscle spasms. (Patient not taking: No sig reported) 30 tablet 1    pantoprazole (PROTONIX) 20 MG tablet Take 1 tablet (20 mg total) by mouth 2 (two) times daily. (Patient not taking: No sig  reported) 60 tablet 3    promethazine (PHENERGAN) 12.5 MG tablet Take 1 tablet (12.5 mg total) by mouth every 6 (six) hours as needed for nausea or vomiting. (Patient not taking: No sig reported) 30 tablet 1     Review of Systems   All systems reviewed and negative except as stated in HPI Blood pressure 124/73, pulse 70, temperature 98.5 F (36.9 C), temperature source Oral, resp. rate 18, last menstrual period 06/12/2020, SpO2 100 %. General appearance: alert, cooperative, and appears stated age Lungs: normal WOB Heart: regular rate  Abdomen: soft, non-tender Extremities: no sign of DVT Presentation: breech on bedside ultrasound Fetal monitoringBaseline: 135 bpm, Variability: Good {> 6 bpm), Accelerations: Reactive, and Decelerations: Absent Uterine activity: rare contractions Dilation: 1 Effacement (%): 60 Station: -2 Exam by::  Tomasa Hose, RN   Prenatal labs: ABO, Rh: --/--/O POS (07/01 1106) Antibody: NEG (07/01 1106) Rubella: 2.12 (12/17 1122) RPR: Reactive (07/01 1107)  HBsAg: Negative (12/17 1122)  HIV: Non Reactive (04/11 0910)  GBS: Negative/-- (06/15 1149)  2 hr Glucola wnl Genetic screening wnl Anatomy US wnl except for marginal cord insertion  Prenatal Transfer Tool  Maternal Diabetes: No Genetic Screening: Normal Maternal Ultrasounds/Referrals: Normal Fetal Ultrasounds or other Referrals:  None Maternal Substance Abuse:  No Significant Maternal Medications:  Meds include: Protonix Significant Maternal Lab Results: Group B Strep negative  Results for orders placed or performed during the hospital encounter of 03/10/21 (from the past 72 hour(s))  Type and screen     Status: None   Collection Time: 03/10/21 11:06 AM  Result Value Ref Range   ABO/RH(D) O POS    Antibody Screen NEG    Sample Expiration      03/13/2021,2359 Performed at [facility], [address], [place], [place] [address]   CBC     Status: None   Collection Time: 03/10/21 11:07 AM  Result Value Ref Range   WBC 9.6 4.0 - 10.5 K/uL   RBC 4.56 3.87 - 5.11 MIL/uL   Hemoglobin 13.2 12.0 - 15.0 g/dL   HCT 40.7 36.0 - 46.0 %   MCV 89.3 80.0 - 100.0 fL   MCH 28.9 26.0 - 34.0 pg   MCHC 32.4 30.0 - 36.0 g/dL   RDW 14.2 11.5 - 15.5 %   Platelets 190 150 - 400 K/uL   nRBC 0.0 0.0 - 0.2 %    Comment: Performed at [facility], [place] [address], [place], [place] [address]  Comprehensive metabolic panel     Status: Abnormal   Collection Time: 03/10/21 11:07 AM  Result Value Ref Range   Sodium 135 135 - 145 mmol/L   Potassium 4.1 3.5 - 5.1 mmol/L   Chloride 105 98 - 111 mmol/L   CO2 19 (L) 22 - 32 mmol/L   Glucose, Bld 80 70 - 99 mg/dL    Comment: Glucose reference range applies only to samples taken after fasting for at least 8 hours.   BUN 9 6 - 20 mg/dL   Creatinine, Ser 0.59 0.44 - 1.00 mg/dL   Calcium  9.5 8.9 - 10.3 mg/dL   Total Protein 6.8 6.5 - 8.1 g/dL   Albumin 3.2 (L) 3.5 - 5.0 g/dL   AST 22 15 - 41 U/L   ALT 13 0 - 44 U/L   Alkaline Phosphatase 189 (H) 38 - 126 U/L   Total Bilirubin 0.8 0.3 - 1.2 mg/dL   GFR, Estimated >60 >60 mL/min  Comment: (NOTE) Calculated using the CKD-EPI Creatinine Equation (2021)    Anion gap 11 5 - 15    Comment: Performed at [facility], [place] [address], [place], [place] [address]  RPR     Status: Abnormal   Collection Time: 03/10/21 11:07 AM  Result Value Ref Range   RPR Ser Ql Reactive (A) NON REACTIVE    Comment: SENT FOR CONFIRMATION   RPR Titer 1:1     Comment: Performed at [facility], [place] [address], [place], [place] [address]     Patient Active Problem List   Diagnosis Date Noted   Supervision of low-risk pregnancy, third trimester 03/12/2021   Breech presentation 03/10/2021   Marginal insertion of umbilical cord affecting management of mother 12/19/2020   Gastritis 10/21/2020   History of appendectomy 08/26/2020   Supervision of other normal pregnancy, antepartum 07/26/2020   PCOS (polycystic ovarian syndrome)     Assessment/Plan:  Sharisa Toves is a 20 y.o. G1P0000 at 56w0dhere for PROM at [place] 03[place]12[place]21 in the setting of breech fetal presentation.  #Primary Cesarean for Breech Fetal Presentation. The risks of cesarean section were discussed with the patient including but were not limited to: bleeding which may require transfusion or reoperation; infection which may require antibiotics; injury to bowel, bladder, ureters or other surrounding organs; injury to the fetus; need for additional procedures including hysterectomy in the event of a life-threatening hemorrhage; placental abnormalities wth subsequent pregnancies, incisional problems, thromboembolic phenomenon and other postoperative/anesthesia complications. The patient concurred with the proposed plan, giving informed written consent for the  procedure.  Patient has been NPO since 2100 03/12/2021, she will remain NPO for procedure. Anesthesia and OR aware.  Preoperative prophylactic antibiotics (Ancef and Azithromycin) and SCDs ordered on call to the OR.  To OR when ready.  #Pain: Per anesthesia #FWB: Category 1 strip #ID: GBS negative #MOF: breast #MOC: POPs #Circ: n/a #Gastritis: plan for H. Pylori testing after delivery  ARanda Ngo MD  03/12/2021, 3:14 AM    Attestation of Attending Supervision of Obstetric Fellow: Evaluation and management procedures were performed by the Obstetric Fellow under my supervision and collaboration.  I have reviewed the Obstetric Fellow's note and chart, and I agree with the management and plan. I have also made any necessary editorial changes.  Patient met and I consented the patient for the procedure as above. Used [employer] Spanish interpreter services.  All questions answered.  TO OR when ready.   UVerita Schneiders MD, FHamptonAttending OTroup FCamc Memorial Hospitalfor [employer] C[place]ert

## 2021-03-12 NOTE — Discharge Summary (Signed)
   Postpartum Discharge Summary   Patient Name: Belinda May DOB: 03/19/2001 MRN: [id number]  Date of admission: 03/12/2021 Delivery date:03/12/2021  Delivering provider: ANYANWU, UGONNA A  Date of discharge: 03/14/2021  Admitting diagnosis: Supervision of low-risk pregnancy, third trimester [Z34.93] S/P cesarean section [Z98.891] Intrauterine pregnancy: [redacted]w[redacted]d     Secondary diagnosis:  Principal Problem:   S/P cesarean section Active Problems:   Gastritis   Marginal insertion of umbilical cord affecting management of mother   Breech presentation  Additional problems: as noted above   Discharge diagnosis: Primary Cesarean for Breech                                        Post partum procedures: none Augmentation: N/A Complications: None  Hospital course: Sceduled C/S   20 y.o. yo G1P1001 at [redacted]w[redacted]d was admitted to the hospital 03/12/2021 s/p PROM at 0130 on 03/12/21. She was taken for primary Cesarean given:Malpresentation (frank breech). Delivery details are as follows:  Membrane Rupture Time/Date: 1:30 AM ,03/12/2021   Delivery Method:C-Section, Low Transverse  Details of operation can be found in separate operative note.  Patient had an uncomplicated postpartum course.  She is ambulating, tolerating a regular diet, passing flatus, and urinating well. Patient is discharged home in stable condition on  03/14/21        Newborn Data: Birth date:03/12/2021  Birth time:6:03 AM  Gender:Female  Living status:Living  Apgars:9 ,9  Weight:3330 g     Magnesium Sulfate received: No BMZ received: No Rhophylac:N/A MMR:N/A T-DaP:Given prenatally Flu: Yes Transfusion:No  Physical exam  Vitals:   03/13/21 0017 03/13/21 0810 03/13/21 1442 03/14/21 0651  BP: (!) 106/59 109/61 105/72 123/76  Pulse: 94 78 91 84  Resp: 18 16 17 16  Temp: 98.5 F (36.9 C) 98.1 F (36.7 C) 97.9 F (36.6 C) 98 F (36.7 C)  TempSrc: Oral Oral Oral Oral  SpO2: 98%  98%    General: alert, cooperative, and  no distress Lochia: appropriate Uterine Fundus: firm Incision: Healing well with no significant drainage DVT Evaluation: No evidence of DVT seen on physical exam. Labs: Lab Results  Component Value Date   WBC 10.2 03/13/2021   HGB 10.0 (L) 03/13/2021   HCT 30.3 (L) 03/13/2021   MCV 89.6 03/13/2021   PLT 153 03/13/2021   CMP Latest Ref Rng & Units 03/13/2021  Glucose 70 - 99 mg/dL -  BUN 6 - 20 mg/dL -  Creatinine 0.44 - 1.00 mg/dL 0.59  Sodium 135 - 145 mmol/L -  Potassium 3.5 - 5.1 mmol/L -  Chloride 98 - 111 mmol/L -  CO2 22 - 32 mmol/L -  Calcium 8.9 - 10.3 mg/dL -  Total Protein 6.5 - 8.1 g/dL -  Total Bilirubin 0.3 - 1.2 mg/dL -  Alkaline Phos 38 - 126 U/L -  AST 15 - 41 U/L -  ALT 0 - 44 U/L -   Edinburgh Score: Edinburgh Postnatal Depression Scale Screening Tool 03/14/2021  I have been able to laugh and see the funny side of things. 1  I have looked forward with enjoyment to things. 0  I have blamed myself unnecessarily when things went wrong. 2  I have been anxious or worried for no good reason. 2  I have felt scared or panicky for no good reason. 3  Things have been getting on top of me. 2  I have been   so unhappy that I have had difficulty sleeping. 0  I have felt sad or miserable. 0  I have been so unhappy that I have been crying. 0  The thought of harming myself has occurred to me. 0  Edinburgh Postnatal Depression Scale Total 10     After visit meds:  Allergies as of 03/14/2021       Reactions   Tylenol [acetaminophen] Itching   Itching, burning and redness around mouth         Medication List     STOP taking these medications    cyclobenzaprine 10 MG tablet Commonly known as: FLEXERIL   pantoprazole 20 MG tablet Commonly known as: Protonix   promethazine 12.5 MG tablet Commonly known as: PHENERGAN       TAKE these medications    coconut oil Oil Apply 1 application topically as needed.   gabapentin 300 MG capsule Commonly known as:  NEURONTIN Take 1 capsule (300 mg total) by mouth 2 (two) times daily.   ibuprofen 600 MG tablet Commonly known as: ADVIL Take 1 tablet (600 mg total) by mouth every 6 (six) hours.   oxyCODONE 5 MG immediate release tablet Commonly known as: Oxy IR/ROXICODONE Take 1-2 tablets (5-10 mg total) by mouth every 4 (four) hours as needed for moderate pain.   PRENATAL VITAMIN PO Take by mouth.   senna-docusate 8.6-50 MG tablet Commonly known as: Senokot-S Take 2 tablets by mouth daily.               Discharge Care Instructions  (From admission, onward)           Start     Ordered   03/14/21 0000  Discharge wound care:       Comments: Remove honeycomb dressing postoperative day 5-7.   03/14/21 0828             Discharge home in stable condition Infant Feeding: Breast Infant Disposition:home with mother Discharge instruction: per After Visit Summary and Postpartum booklet. Activity: Advance as tolerated. Pelvic rest for 6 weeks.  Diet: routine diet Future Appointments:No future appointments. Follow up Visit: Message sent to [facility] by [place].  Please schedule this patient for a In person postpartum visit in 6 weeks with the following provider: Any provider. Additional Postpartum F/U:Incision check 1 week  Low risk pregnancy complicated by:  breech presentation Delivery mode:  C-Section, Low Transverse  Anticipated Birth Control:  POPs or nexplanon (will have this done at [facility])    03/14/2021 Julia M Marsala, MD    

## 2021-03-12 NOTE — Lactation Note (Signed)
This note was copied from a baby's chart. Lactation Consultation Note  Patient Name: Belinda May [id number] Date: 03/12/2021 Reason for consult: Follow-up assessment;Mother's request;Difficult latch;Primapara;1st time breastfeeding;Term Age:21 hours  Mother fed infant formula at 6 pm 10 ml before LC visit. Mom to call for assistance with latching for next feeding.   Mom flat nipples that will erect more with pumping and stimulation. Breast shells provided with instructions to wear not pumping, sleeping or nursing.   Mom very small nipples coconut oil provided to use before pumping.  Mom hxPCOS that can effect milk supply.  Mom DEBP q 3hrs for 15 min.  Plan 1. To feed based on cues 8-12x in 24 hr period no more than 4 hrs without an attempt. Mom wearing breast shells to elongate her nipples/ pre pump 5-10 min before latching.           2. Mom to supplement based on bf supplementation guide offering any EBM first followed by formula with paced bottle feeding using extra slow flow nipple.          3. DEBP q 3 hrs for 15 min           All questions answered at the end of the visit.  Maternal Data Has patient been taught Hand Expression?: Yes Does the patient have breastfeeding experience prior to this delivery?: No  Feeding Mother's Current Feeding Choice: Breast Milk and Formula  LATCH Score                    Lactation Tools Discussed/Used Tools: Pump;Flanges Flange Size: 21 (may need inserts if flange 21 too big.. pump on low setting) Breast pump type: Double-Electric Breast Pump Pump Education: Setup, frequency, and cleaning;Milk Storage Reason for Pumping: increase stimulation Pumping frequency: every 3 hrs for 15 min  Interventions Interventions: Breast feeding basics reviewed;Assisted with latch;Adjust position;Support pillows;DEBP;Breast massage;Position options;Hand express;Expressed milk;Education;Pre-pump if needed;Shells  Discharge    Consult  Status Consult Status: Follow-up Date: 03/13/21 Follow-up type: In-patient    Belinda May  Nicholson-Springer 03/12/2021, 8:34 PM

## 2021-03-12 NOTE — MAU Note (Addendum)
Belinda May is a 20 y.o. at [redacted]w[redacted]d here in MAU reporting: pt reports her rwater broke around 0130 this morning. Patient is not experiencing any pain or contractions. No abnormal discharge or bleeding. Pt reports + fetal movement.   Onset of complaint: 03/12/2021 Pain score: 0/10 There were no vitals filed for this visit.    Lab orders placed from triage:

## 2021-03-13 LAB — CBC
HCT: 30.3 % — ABNORMAL LOW (ref 36.0–46.0)
Hemoglobin: 10 g/dL — ABNORMAL LOW (ref 12.0–15.0)
MCH: 29.6 pg (ref 26.0–34.0)
MCHC: 33 g/dL (ref 30.0–36.0)
MCV: 89.6 fL (ref 80.0–100.0)
Platelets: 153 10*3/uL (ref 150–400)
RBC: 3.38 MIL/uL — ABNORMAL LOW (ref 3.87–5.11)
RDW: 14.2 % (ref 11.5–15.5)
WBC: 10.2 10*3/uL (ref 4.0–10.5)
nRBC: 0 % (ref 0.0–0.2)

## 2021-03-13 LAB — CREATININE, SERUM
Creatinine, Ser: 0.59 mg/dL (ref 0.44–1.00)
GFR, Estimated: >60 mL/min (ref >60)

## 2021-03-13 MED ORDER — SIMETHICONE 80 MG PO CHEW
80.0000 mg | CHEWABLE_TABLET | Freq: Three times a day (TID) | ORAL | Status: DC
  Administered 2021-03-13 – 2021-03-14 (×4): 80 mg via ORAL
  Filled 2021-03-13 (×4): qty 1

## 2021-03-13 NOTE — Anesthesia Postprocedure Evaluation (Signed)
Anesthesia Post Note  Patient: Belinda May  Procedure(s) Performed: CESAREAN SECTION     Patient location during evaluation: PACU Anesthesia Type: Spinal Level of consciousness: awake and alert Pain management: pain level controlled Vital Signs Assessment: post-procedure vital signs reviewed and stable Respiratory status: spontaneous breathing Cardiovascular status: stable Postop Assessment: spinal receding Anesthetic complications: no   No notable events documented.  Last Vitals:  Vitals:   03/13/21 0810 03/13/21 1442  BP: 109/61 105/72  Pulse: 78 91  Resp: 16 17  Temp: 36.7 C 36.6 C  SpO2:  98%    Last Pain:  Vitals:   03/13/21 1442  TempSrc: Oral  PainSc:                  Lewie Loron

## 2021-03-13 NOTE — Lactation Note (Signed)
This note was copied from a baby's chart. Lactation Consultation Note  Patient Name: Boy Nichoel Digiulio [id number] Date: 03/13/2021 Reason for consult: Follow-up assessment;Term;Primapara;1st time breastfeeding Age:21 hours   P1 mother whose infant is now 11 hours old.  This is a term baby at 39+0 weeks.  Mother's feeding preference is breast/formula.  Baby has been primarily formula feeding during the night.  Mother reported that breast feeding is painful.  Discussed breast feeding basics and how to obtain a deep latch.  Asked mother to call for my assistance with the next feeding so I may assess her situation with a painful latch.  Her breasts are soft and non tender and nipples are short shafted and intact; no trauma noted.  Mother has breast shells at bedside.  Mother has a DEBP at bedside; required review of pump parts and settings.  Observed mother pumping with the #24 flange size which is appropriate at this time.  Lubricated flanges with coconut oil prior to pumping.  Set up wash station, reviewed and explained how father can assist with cleaning pump parts.  Mother is unable to express colostrum currently but will continue practicing hand expression.  RN updated and mother will receive latch assistance at the next feeding.  Reviewed supplementation guidelines with parents.  Father very supportive and assisting with newborn care.     Maternal Data Has patient been taught Hand Expression?: Yes Does the patient have breastfeeding experience prior to this delivery?: No  Feeding Mother's Current Feeding Choice: Breast Milk and Formula Nipple Type: Extra Slow Flow  LATCH Score                    Lactation Tools Discussed/Used Tools: Shells;Pump;Flanges;Coconut oil Flange Size: 24 Breast pump type: Double-Electric Breast Pump;Manual Pump Education: Setup, frequency, and cleaning;Milk Storage (Reviewed) Reason for Pumping: Breast stimulation; supplementation Pumping  frequency: Every three hours  Interventions    Discharge Pump: DEBP;Manual WIC Program: No  Consult Status Consult Status: Follow-up Date: 03/14/21 Follow-up type: In-patient    Elnor Renovato R Reynard Christoffersen 03/13/2021, 9:17 AM

## 2021-03-13 NOTE — Lactation Note (Signed)
This note was copied from a baby's chart. Lactation Consultation Note  Patient Name: Belinda May [id number] Date: 03/13/2021 Reason for consult: Follow-up assessment;Primapara;1st time breastfeeding;Term Age:21 hours   P1 mother whose infant is now 28 hours old.  This is a term baby at 39+0 weeks.  Mother's feeding preference is breast/formula.  RN in room assisting when I arrived.  Baby positioned at the left breast in the football hold.  Attempted to latch without a NS, however, this was unsuccessful.  Mother has short shafted nipples and baby frustrated attempting to latch.  Mother had a #20 NS at bedside.  Placed NS to breast and he still refused to suck; pushed back off the breast.  Suggested the cross cradle position and he was able to latch better.  He would suck well for a few sucks and push back.  Multiple attempts needed to keep him sucking effectively.  After 8 minutes I suggested mother supplement.  No EBM in NS tip upon completion.  Grandmother in room assisting with care.  Mother wanted to try her own bottle from home.  Initially, baby would not suck with this nipple, but demonstrated how to obtain a grasp and to support him well and he began sucking.  He had consumed 15 mls when I left the room.  Reminded mother that if he does not feed well with this bottle/nipple we will put him back on the purple extra slow flow that he was using.  Mother verbalized understanding.  Mother will call for further assistance as needed.   Maternal Data Has patient been taught Hand Expression?: Yes Does the patient have breastfeeding experience prior to this delivery?: No  Feeding Mother's Current Feeding Choice: Breast Milk and Formula  LATCH Score Latch: Repeated attempts needed to sustain latch, nipple held in mouth throughout feeding, stimulation needed to elicit sucking reflex.  Audible Swallowing: None  Type of Nipple: Everted at rest and after stimulation (short  shafted)  Comfort (Breast/Nipple): Soft / non-tender  Hold (Positioning): Assistance needed to correctly position infant at breast and maintain latch.  LATCH Score: 6   Lactation Tools Discussed/Used Tools: Shells;Pump;Flanges;Coconut oil;Nipple Shields Nipple shield size: 20 Flange Size: 24 Breast pump type: Double-Electric Breast Pump;Manual Pump Education: Setup, frequency, and cleaning (Reviewed) Reason for Pumping: Breast stimulation and NS use Pumping frequency: After every feeding  Interventions Interventions: Breast feeding basics reviewed;Assisted with latch;Skin to skin;Breast massage;Hand express;Pre-pump if needed;Breast compression;Adjust position;DEBP;Hand pump;Shells;Coconut oil;Position options;Support pillows;Education  Discharge Pump: Manual;DEBP  Consult Status Consult Status: Follow-up Date: 03/14/21 Follow-up type: In-patient    Colston Pyle R Tylor Gambrill 03/13/2021, 2:05 PM

## 2021-03-13 NOTE — Progress Notes (Signed)
POSTPARTUM PROGRESS NOTE  Subjective: Belinda May is a 20 y.o. G1P1001 s/p pLTCS at [redacted]w[redacted]d.  She reports she doing well. No acute events overnight. She denies any problems with ambulating, voiding or po intake. Denies nausea or vomiting. She has not passed flatus. Pain is poorly controlled.  Lochia is mild.  Objective: Blood pressure (!) 106/59, pulse 94, temperature 98.5 F (36.9 C), temperature source Oral, resp. rate 18, last menstrual period 06/12/2020, SpO2 98 %, unknown if currently breastfeeding.  Physical Exam:  General: alert, cooperative and no distress Chest: no respiratory distress Abdomen: soft, appropriately tender, pressure dressing in place c/d/I   Uterine Fundus: firm and at level of umbilicus Extremities: No calf swelling or tenderness  no edema  Recent Labs    03/10/21 1107 03/13/21 0422  HGB 13.2 10.0*  HCT 40.7 30.3*    Assessment/Plan: Belinda May is a 20 y.o. G1P1001 s/p pLTCS at [redacted]w[redacted]d for breech presentation. POD#1.  Routine Postpartum Care: Doing well, pain well-controlled.  -- Continue routine care, lactation support  -- Contraception: desires outpatient nexplanon -- Feeding: breast and formula -- Acute blood loss Anemia: Start PO iron -- Hx gastritis: H Pylori testing ordered. Has not yet had a bowel movement. Possibly contributing to patient's pain.     Dispo: Plan for discharge POD#2-3. Gita Kudo, MD OB Fellow, [facility] 03/13/2021 6:41 AM

## 2021-03-14 MED ORDER — OXYCODONE HCL 5 MG PO TABS: 5.0000 mg | ORAL_TABLET | ORAL | 0 refills | Status: DC | PRN

## 2021-03-14 MED ORDER — SENNOSIDES-DOCUSATE SODIUM 8.6-50 MG PO TABS: 2.0000 | ORAL_TABLET | Freq: Every day | ORAL | 0 refills | Status: DC

## 2021-03-14 MED ORDER — IBUPROFEN 600 MG PO TABS: 600.0000 mg | ORAL_TABLET | Freq: Four times a day (QID) | ORAL | 0 refills | Status: AC

## 2021-03-14 MED ORDER — GABAPENTIN 300 MG PO CAPS: 300.0000 mg | ORAL_CAPSULE | Freq: Two times a day (BID) | ORAL | 0 refills | Status: DC

## 2021-03-14 MED ORDER — COCONUT OIL OIL: 1.0000 "application " | TOPICAL_OIL | 0 refills | Status: DC | PRN

## 2021-03-14 NOTE — Social Work (Signed)
CSW received consult for Edinburgh, score 10. CSW met with MOB to offer support and complete assessment.    CSW met with MOB at bedside. CSW observed MOB sitting on couch and support person holding the baby. CSW used Spanish interpreter# [id number]. CSW congratulated MOB and introduced role. CSW offered MOB privacy. MOB introduced her support person as her mother and wanted her to stay during the assessment. MOB presented pleasant, calm and engaged with CSW. CSW inquired how MOB has felt since giving birth. MOB express feeling okay and happy the baby is here.   CSW explain the Edinburgh and discussed MOB answers. CSW inquired about MOB feeling panicky and scared. MOB reports she was very nervous about the birth and worried about the birthing process. MOB reports she was worried about the risk associated with the c-section and the recovery. MOB was expecting to have a vaginal birth and had c-section. CSW inquired about MOB blaming herself for unnecessary things. MOB reports she does not feel in control of things because she wants to breastfeed the baby however, he not latching, and she is not producing a sufficient supply of milk to feed the baby. CSW validated MOB concerns and offered encouraging words. CSW inquired if MOB has history of anxiety or depression. MOB reports she was diagnosed with anxiety about four years ago. MOB reports her anxiety was triggered by family problems and stress. She saw a psychologist who helped her process her problems and stress. MOB reports since therapy she feels in control of her anxiety and feels normal and happy. CSW inquired about MOB supports. MOB acknowledges her mother and family as supports. CSW inquired about MOB coping skills. MOB reports to calm herself down she draws pictures. CSW praised MOB for her coping skills and encouraged MOB to continue using her coping skills and family supports. CSW provided education regarding the baby blues period vs. perinatal mood  disorders, discussed treatment and gave resources for mental health follow up if concerns arise.  CSW recommended MOB complete a self-evaluation during the postpartum time period using the New Mom Checklist from Postpartum Progress and encouraged MOB to contact a medical professional if symptoms are noted at any time. CSW assessed MOB for safety. MOB denies thoughts of harm to self and others.    CSW provided review of Sudden Infant Death Syndrome (SIDS) precautions and informed MOB no co-sleeping with the infant. MOB reports understanding and the infant will sleep in a bassinet. CSW inquired if MOB has essential items for the infant. MOB reports he has essential items for the infant. MOB reports she plans to discuss formula brand to purchase for the baby. CSW inquired if MOB receives WIC/FS. MOB reports she is not sure if she will qualify. CSW provided MOB with information to determine if she qualifies. MOB reports [facility] CSW assessed MOB for further needs. MOB reports no additional need.    CSW identifies no further need for intervention and no barriers to discharge at this time.   Osmar Howton, MSW, LCSW [facility]  Clinical Social Worker  [phone] 03/14/2021  1:28 PM  

## 2021-03-14 NOTE — Discharge Instructions (Addendum)
Please make an appointment with [facility] Department to discuss birth control.   Dressing can be removed post-op day 5-7

## 2021-03-14 NOTE — Lactation Note (Signed)
This note was copied from a baby's chart. Lactation Consultation Note RN told LC that mom was having difficulty BF. LC attempted to see mom . Mom was sleeping, FOB was holding baby. LC held up badge showing LC and said call me for feeding. FOB gave me thumbs up. I didn't get a call. LC went to check on mom. Mom awake holding baby in bed. FOB stated the baby is latching well now w/NS but mom doesn't know if baby is getting anything or not. LC encouraged mom and FOB to look in NS when the baby is finished and see if they see anything in NS. Mom stated she doesn't. FOB stated they are also giving formula. FOB stated they are giving the amount according to if he BF or not is how much they give. Praised parents.  Encouraged to call for assistance or questions.  NO charge.  Patient Name: Belinda May OVANV'B Date: 03/14/2021   Age:21 hours  Maternal Data    Feeding    LATCH Score                    Lactation Tools Discussed/Used    Interventions    Discharge    Consult Status      Charyl Dancer 03/14/2021, 6:03 AM

## 2021-03-14 NOTE — Lactation Note (Signed)
This note was copied from a baby's chart. Lactation Consultation Note  Patient Name: Belinda May [id number] Date: 03/14/2021 Reason for consult: Follow-up assessment Age:21 Hours Mother is a P46,  Mother has been mostly bottle feeding. Mother has flat nipples when compressed. She has shells at the bedside but has not been wearing then. Mother pumped 3 times yesterday. She has hand expressed several times but still doesn't see any colostrum. Mother assist with pumping for 15 mins . No colostrum obtained. Gave mother a hand pump and assist her with hand pumping and hand expressing and no colostrum obtained. Mother reports she felt something wet on her nipple.  Grandmother has just fed infant a bottle that he took 37 ml.  Offered to assist mother with latching infant on using a NS.  Pre -pumped mothers breasts and applied the #24 NS. Infant latched quickly . He was given some formula with a curved tip syringe through the nipple shield. Infant  had wide open flanged lips  Discussed treatment and prevention of engorgement.  Plan of Care : Breastfeed infant with feeding cues Supplement infant with ebm/formula, according to supplemental guidelines. Pump using a DEBP after each feeding for 15-20 mins.   Mother to continue to cue base feed infant and feed at least 8-12 times or more in 24 hours and advised to allow for cluster feeding infant as needed.   Mother to continue to due STS. Mother is aware of available LC services at [facility], BFSG'S, OP Dept, and phone # for questions or concerns about breastfeeding.  Mother receptive to all teaching and plan of care.  Recommended that mother follow up with LC OP services. Sent message to OP office.  Maternal Data    Feeding Mother's Current Feeding Choice: Breast Milk and Formula Nipple Type: Extra Slow Flow  LATCH Score Latch: Grasps breast easily, tongue down, lips flanged, rhythmical sucking.  Audible Swallowing: Spontaneous and  intermittent  Type of Nipple: Everted at rest and after stimulation  Comfort (Breast/Nipple): Soft / non-tender (pre pumped RT nipple and applied NS on.)  Hold (Positioning): Assistance needed to correctly position infant at breast and maintain latch.  LATCH Score: 9   Lactation Tools Discussed/Used Tools: Nipple Shields Nipple shield size: 24 Flange Size: 24 Breast pump type: Double-Electric Breast Pump;Manual Pump Education: Setup, frequency, and cleaning;Milk Storage  Interventions Interventions: Breast feeding basics reviewed;Assisted with latch;Skin to skin;Hand express;Pre-pump if needed;Breast compression;Adjust position;Support pillows;Position options;Expressed milk;Hand pump;DEBP;Shells  Discharge Discharge Education: Engorgement and breast care;Warning signs for feeding baby;Outpatient recommendation Pump: Manual;Personal  Consult Status Consult Status: Complete    Michel Bickers 03/14/2021, 1:10 PM

## 2021-03-15 LAB — T.PALLIDUM AB, TOTAL: T Pallidum Abs: NONREACTIVE

## 2021-03-16 ENCOUNTER — Encounter: Payer: Self-pay | Admitting: Family Medicine

## 2021-03-16 DIAGNOSIS — R768 Other specified abnormal immunological findings in serum: Secondary | ICD-10-CM | POA: Insufficient documentation

## 2021-03-19 ENCOUNTER — Encounter (HOSPITAL_COMMUNITY): Payer: Self-pay

## 2021-03-22 ENCOUNTER — Telehealth (HOSPITAL_COMMUNITY): Payer: Self-pay | Admitting: *Deleted

## 2021-03-22 NOTE — Telephone Encounter (Signed)
Language Line left message to return nurse call.  Duffy Rhody, RN 03/22/2021 at 12:02pm

## 2021-03-27 ENCOUNTER — Other Ambulatory Visit: Payer: Self-pay

## 2021-03-27 ENCOUNTER — Ambulatory Visit (INDEPENDENT_AMBULATORY_CARE_PROVIDER_SITE_OTHER): Payer: Self-pay

## 2021-03-27 VITALS — BP 130/82 | HR 76 | Ht 62.0 in | Wt 198.6 lb

## 2021-03-27 DIAGNOSIS — Z98891 History of uterine scar from previous surgery: Secondary | ICD-10-CM

## 2021-03-27 NOTE — Progress Notes (Signed)
Agree with A & P. 

## 2021-03-27 NOTE — Progress Notes (Signed)
Pt here today for wound check. Pt had C-section on 03/12/2021.  Pt denies any drainage, redness or fever to site.   Incision today is clean, dry, intact and well approximated. Pt states took honeycomb dressing off after shower on Friday and tolerated well.   Pt advised to keep clean and dry. Pt verbalized understanding.   Pt is not scheduled for PP visit, will make with front office today at check out.   Judeth Cornfield, RN

## 2021-04-28 ENCOUNTER — Other Ambulatory Visit: Payer: Self-pay

## 2021-04-28 ENCOUNTER — Encounter: Payer: Self-pay | Admitting: Family Medicine

## 2021-04-28 ENCOUNTER — Ambulatory Visit (INDEPENDENT_AMBULATORY_CARE_PROVIDER_SITE_OTHER): Payer: Self-pay | Admitting: Family Medicine

## 2021-04-28 DIAGNOSIS — Z3009 Encounter for other general counseling and advice on contraception: Secondary | ICD-10-CM

## 2021-04-28 DIAGNOSIS — K5900 Constipation, unspecified: Secondary | ICD-10-CM

## 2021-04-28 DIAGNOSIS — O9963 Diseases of the digestive system complicating the puerperium: Secondary | ICD-10-CM

## 2021-04-28 MED ORDER — POLYETHYLENE GLYCOL 3350 17 GM/SCOOP PO POWD: 17.0000 g | Freq: Every day | ORAL | 5 refills | Status: DC | PRN

## 2021-04-28 NOTE — Progress Notes (Signed)
Post Partum Visit Note  Belinda May is a 20 y.o. G62P1001 female who presents for a postpartum visit. She is 6 weeks postpartum following a primary cesarean section.  I have fully reviewed the prenatal and intrapartum course. The delivery was at 39 gestational weeks.  Anesthesia: epidural. Postpartum course has been uneventful. Baby is doing well. Baby is feeding by both breast and bottle - Similac 360 . Bleeding no bleeding. Bowel function is abnormal: Painful . Bladder function is normal. Patient is sexually active. Contraception method is none. Postpartum depression screening: negative.   The pregnancy intention screening data noted above was reviewed. Potential methods of contraception were discussed. The patient elected to proceed with copper IUD at follow upvisit.   Edinburgh Postnatal Depression Scale - 04/28/21 1029       Edinburgh Postnatal Depression Scale:  In the Past 7 Days   I have been able to laugh and see the funny side of things. 0    I have looked forward with enjoyment to things. 0    I have blamed myself unnecessarily when things went wrong. 0    I have been anxious or worried for no good reason. 0    I have felt scared or panicky for no good reason. 0    Things have been getting on top of me. 0    I have been so unhappy that I have had difficulty sleeping. 0    I have felt sad or miserable. 0    I have been so unhappy that I have been crying. 0    The thought of harming myself has occurred to me. 0    Edinburgh Postnatal Depression Scale Total 0             Health Maintenance Due  Topic Date Due   COVID-19 Vaccine (1) Never done   HPV VACCINES (1 - 2-dose series) Never done   INFLUENZA VACCINE  04/10/2021    The following portions of the patient's history were reviewed and updated as appropriate: allergies, current medications, past family history, past medical history, past social history, past surgical history, and problem list.  Review of  Systems Pertinent items noted in HPI and remainder of comprehensive ROS otherwise negative.  Objective:  BP 119/76   Pulse 86   Wt 202 lb 9.6 oz (91.9 kg)   LMP 04/13/2021 (Exact Date)   Breastfeeding Yes   BMI 37.06 kg/m    General:  alert, cooperative, and appears stated age   Breasts:  not indicated  Lungs: Comfortable on room air       Assessment:    There are no diagnoses linked to this encounter.  Normal postpartum exam.   Plan:   Essential components of care per ACOG recommendations:  1.  Mood and well being: Patient with negative depression screening today. Reviewed local resources for support.  - Patient tobacco use? No.   - hx of drug use? No.    2. Infant care and feeding:  -Patient currently breastmilk feeding? Yes. Reviewed importance of draining breast regularly to support lactation.  -Social determinants of health (SDOH) reviewed in EPIC. No concerns  3. Sexuality, contraception and birth spacing - Patient does not want a pregnancy in the next year.  Desired family size is unsure children.  - Reviewed forms of contraception in tiered fashion. Patient desired copper IUD today.  She does not have insurance activated yet but will soon. Will return around time of her next menstrual cycle  in three weeks for insertion, stressed importance of using barrier methods until that time.  - Discussed birth spacing of 18 months  4. Sleep and fatigue -Encouraged family/partner/community support of 4 hrs of uninterrupted sleep to help with mood and fatigue  5. Physical Recovery  - Discussed patients delivery and complications. She describes her labor as good. - Patient had a C-section. Patient had a  n/a  laceration. Perineal healing reviewed. Patient expressed understanding - Patient has urinary incontinence? No. - Patient is safe to resume physical and sexual activity - Significant constipation, trial miralax  6.  Health Maintenance - HM due items addressed No - none  required - Last pap smear No results found for: DIAGPAP Pap smear not done at today's visit.  -Breast Cancer screening indicated? No.   7. Chronic Disease/Pregnancy Condition follow up: None  - PCP follow up  Venora Maples, MD Center for [facility], [facility]

## 2021-05-19 ENCOUNTER — Ambulatory Visit: Payer: Self-pay | Admitting: Family Medicine

## 2021-06-28 ENCOUNTER — Other Ambulatory Visit: Payer: Self-pay

## 2021-06-28 ENCOUNTER — Other Ambulatory Visit (HOSPITAL_COMMUNITY)
Admission: RE | Admit: 2021-06-28 | Discharge: 2021-06-28 | Disposition: A | Discharge status: Home / Self Care | Payer: Self-pay | Source: Ambulatory Visit | Attending: Family Medicine | Admitting: Family Medicine

## 2021-06-28 ENCOUNTER — Ambulatory Visit (INDEPENDENT_AMBULATORY_CARE_PROVIDER_SITE_OTHER): Payer: 59 | Admitting: Family Medicine

## 2021-06-28 ENCOUNTER — Encounter: Payer: Self-pay | Admitting: Family Medicine

## 2021-06-28 VITALS — BP 124/78 | HR 89 | Wt 210.9 lb

## 2021-06-28 DIAGNOSIS — Z124 Encounter for screening for malignant neoplasm of cervix: Secondary | ICD-10-CM | POA: Insufficient documentation

## 2021-06-28 DIAGNOSIS — Z3202 Encounter for pregnancy test, result negative: Secondary | ICD-10-CM

## 2021-06-28 DIAGNOSIS — Z30430 Encounter for insertion of intrauterine contraceptive device: Secondary | ICD-10-CM

## 2021-06-28 LAB — POCT PREGNANCY, URINE: Preg Test, Ur: NEGATIVE

## 2021-06-28 MED ORDER — PARAGARD INTRAUTERINE COPPER IU IUD
1.0000 | INTRAUTERINE_SYSTEM | Freq: Once | INTRAUTERINE | Status: AC
  Administered 2021-06-28: 1 via INTRAUTERINE

## 2021-06-28 NOTE — Progress Notes (Signed)
    GYNECOLOGY OFFICE PROCEDURE NOTE  Belinda May is a 20 y.o. G1P1001 here for copper IUD insertion. No GYN concerns. Has never had pap smear, this was collected today prior to the procedure.  IUD Insertion Procedure Note Patient identified, informed consent performed, consent signed.   Discussed risks of irregular bleeding, heavier/longer periods, increased cramping, infection, malpositioning or misplacement of the IUD outside the uterus which may require further procedure such as laparoscopy. Also discussed >99% contraception efficacy, increased risk of ectopic pregnancy with failure of method.  Time out was performed.  Urine pregnancy test negative.  Speculum placed in the vagina.  Cervix visualized.  Cleaned with Betadine x 2.  Grasped anteriorly with a single tooth tenaculum.  Uterus sounded to 9 cm. Paragard IUD placed per manufacturer's recommendations.  Strings trimmed to 4 cm. Tenaculum was removed, good hemostasis noted.  Patient tolerated procedure well.   Patient was given post-procedure instructions.  She was advised to have backup contraception for one week.  Patient was also asked to check IUD strings periodically and follow up in 4 weeks for IUD check.   Venora Maples, MD/MPH Attending Family Medicine Physician, [facility] for [facility], [facility]

## 2021-06-29 ENCOUNTER — Encounter: Payer: Self-pay | Admitting: Family Medicine

## 2021-06-29 DIAGNOSIS — Z30430 Encounter for insertion of intrauterine contraceptive device: Secondary | ICD-10-CM | POA: Insufficient documentation

## 2021-06-29 DIAGNOSIS — Z975 Presence of (intrauterine) contraceptive device: Secondary | ICD-10-CM | POA: Insufficient documentation

## 2021-06-30 LAB — CYTOLOGY - PAP
Chlamydia: NEGATIVE
Comment: NEGATIVE
Comment: NEGATIVE
Comment: NORMAL
Diagnosis: NEGATIVE
Neisseria Gonorrhea: NEGATIVE
Trichomonas: NEGATIVE

## 2021-07-28 ENCOUNTER — Encounter: Payer: Self-pay | Admitting: Certified Nurse Midwife

## 2021-07-28 ENCOUNTER — Other Ambulatory Visit: Payer: Self-pay

## 2021-07-28 ENCOUNTER — Ambulatory Visit (INDEPENDENT_AMBULATORY_CARE_PROVIDER_SITE_OTHER): Payer: Self-pay | Admitting: Certified Nurse Midwife

## 2021-07-28 VITALS — BP 116/71 | HR 106 | Ht 62.0 in | Wt 213.1 lb

## 2021-07-28 DIAGNOSIS — T8189XA Other complications of procedures, not elsewhere classified, initial encounter: Secondary | ICD-10-CM

## 2021-07-28 DIAGNOSIS — H5712 Ocular pain, left eye: Secondary | ICD-10-CM

## 2021-07-28 DIAGNOSIS — Z30431 Encounter for routine checking of intrauterine contraceptive device: Secondary | ICD-10-CM

## 2021-07-28 NOTE — Progress Notes (Signed)
   Subjective:   Patient Name: Belinda May, female   DOB: [birth year], 20 y.o.  MRN: [id number]  HPI Patient here for an IUD check.  She had the Paragard IUD placed 1 month ago.  She reports some cramping that just began in the last two days. Also had some chills last night with eye pain, but was afebrile when she checked. Feels better now, just some remaining pain near her eyelid. C/o a small lump near her Cesarean incision that hurts at times but not right now.  Review of Systems  Constitutional: Negative for fever and chills.  Gastrointestinal: Negative for abdominal pain.  Genitourinary: Negative for vaginal discharge, vaginal pain, pelvic pain and dyspareunia.      Objective:   Physical Exam  Constitutional: She appears well-developed and well-nourished.  HENT: no scleral edema or redness, left eyelid slightly puffy in outer corner but no redness or exudate Head: Normocephalic and atraumatic.  Abdominal: Soft. There is no tenderness. There is no guarding. There is a small, oblong, 1x0.5cm palpable lump consistent with either scar tissue, lipoma, or lymphatic drainage located about 1.5cm above the left side of her incision. No redness or edema, not currently tender. Genitourinary: There is no rash, tenderness or lesion on the right labia. There is no rash, tenderness or lesion on the left labia. No erythema or tenderness in the vagina. No foreign body around the vagina. No signs of injury around the vagina. Physiologic vaginal discharge found and some blood noted in cervix, consistent with start of menstrual bleeding.   Skin: Skin is warm and dry.  Psychiatric: She has a normal mood and affect. Her behavior is normal. Judgment and thought content normal.       Assessment & Plan:  1. IUD check up - IUD in place. Menstruation likely to start today.  2. Eye pain - steadily improving, encouraged to follow up with PCP if it continues or gets worse.  3. Incisional irritation -  Advised pt to watch for signs of infection/changes in lump and follow up with OB if needed  Follow up PRN or for annual exam  Edd Arbour, CNM, MSN, IBCLC Certified Nurse Midwife, [facility]

## 2021-07-28 NOTE — Progress Notes (Signed)
Pt states had chills last night but went away, currently having eye pain, advised will need to refer to PCP. Also feels a lump in incision are.

## 2021-12-21 ENCOUNTER — Encounter: Payer: Self-pay | Admitting: Certified Nurse Midwife

## 2021-12-31 IMAGING — US US MFM OB FOLLOW-UP
1 series · 14 of 25 positions shown · non-contrast
Comparison: none

[Series 1: us mfm ob follow-up · 25 acquisitions, 14 frames shown]
[im 1/25]
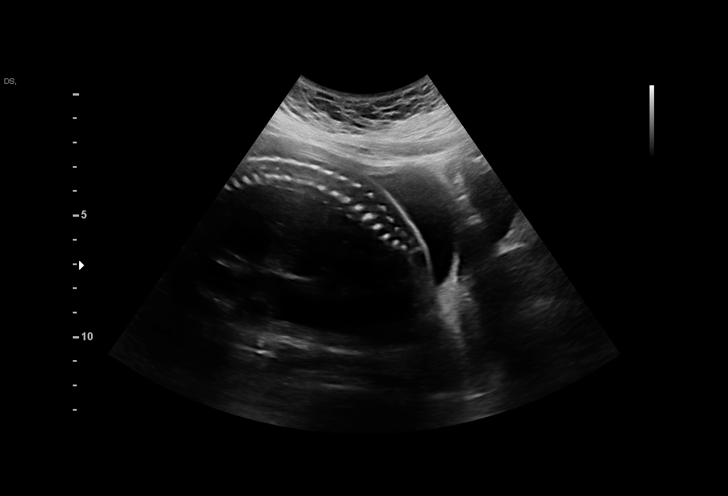
[im 3/25]
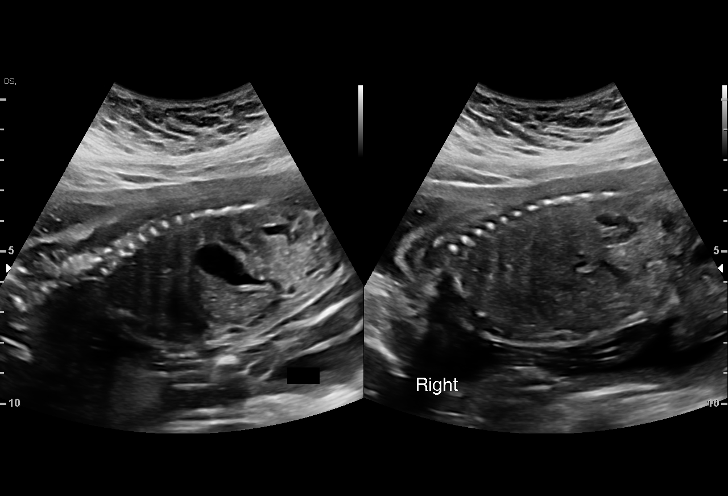
[im 5/25]
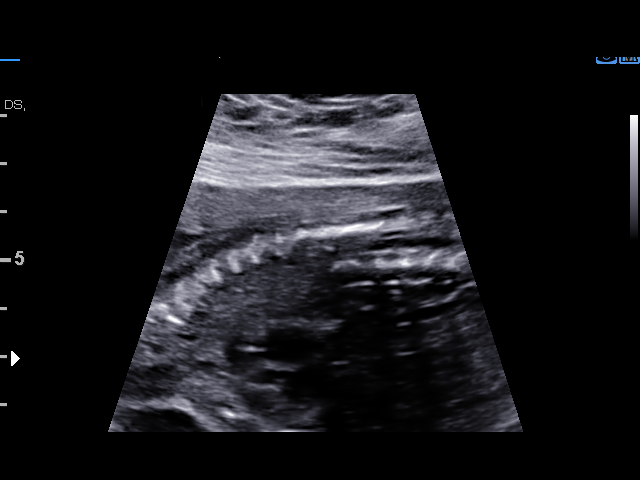
[im 7/25]
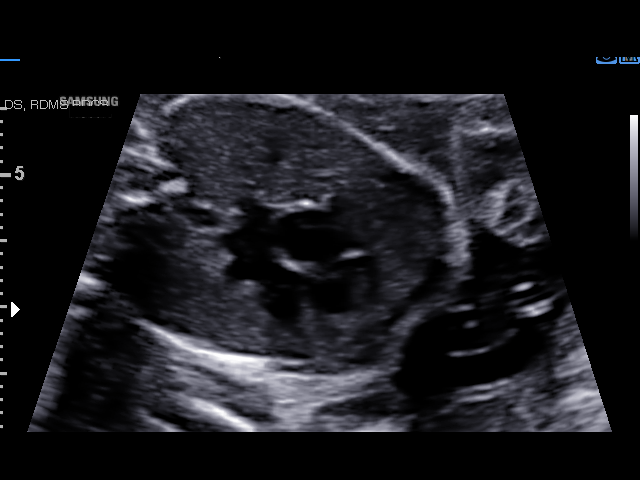
[im 9/25]
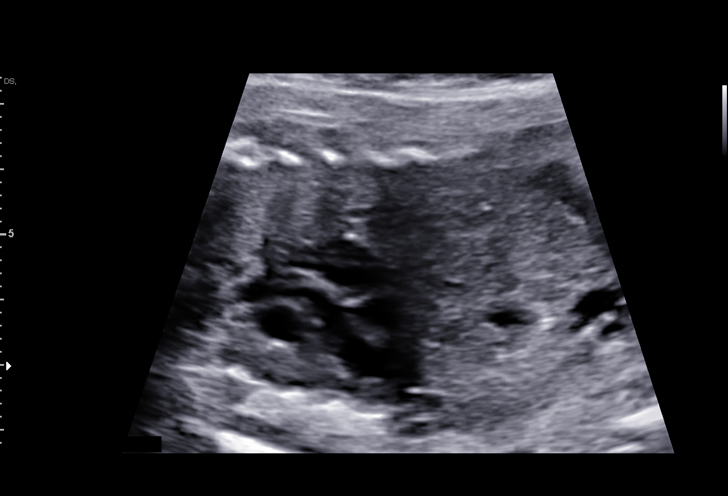
[im 10/25]
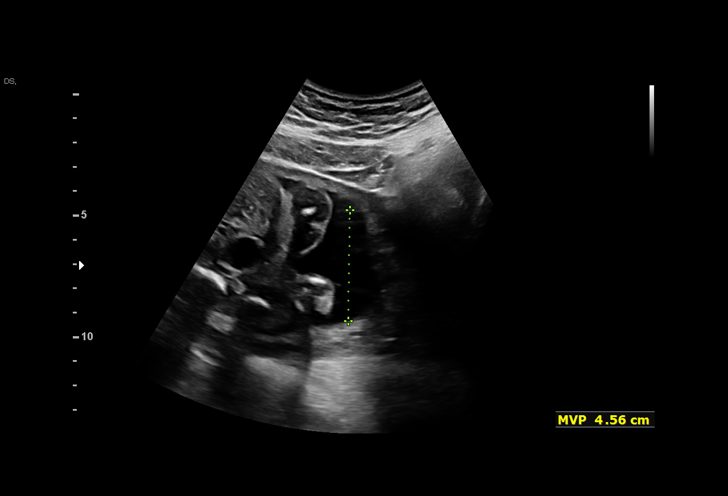
[im 12/25]
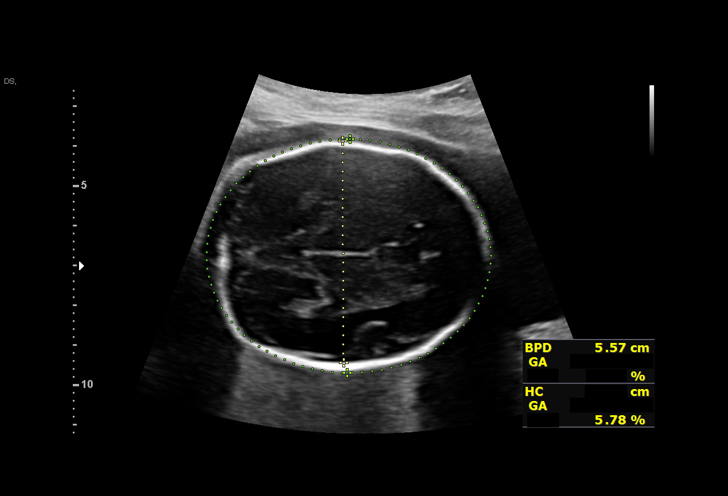
[im 14/25]
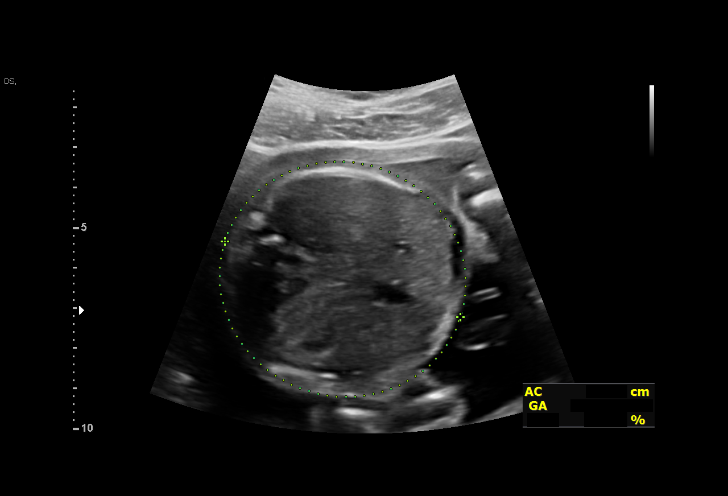
[im 16/25]
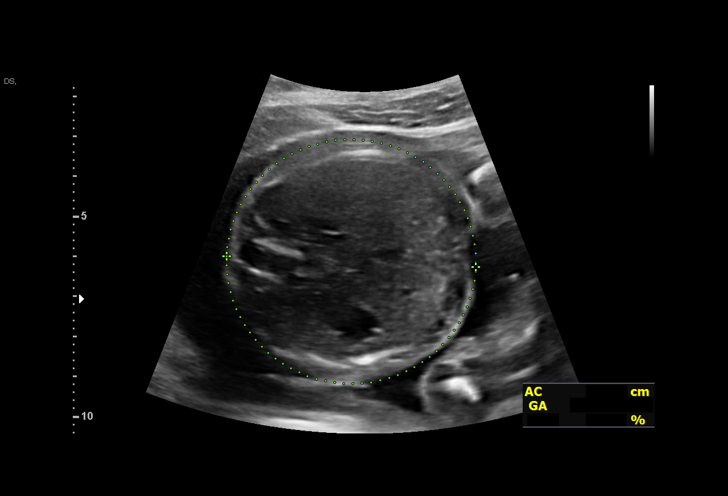
[im 17/25]
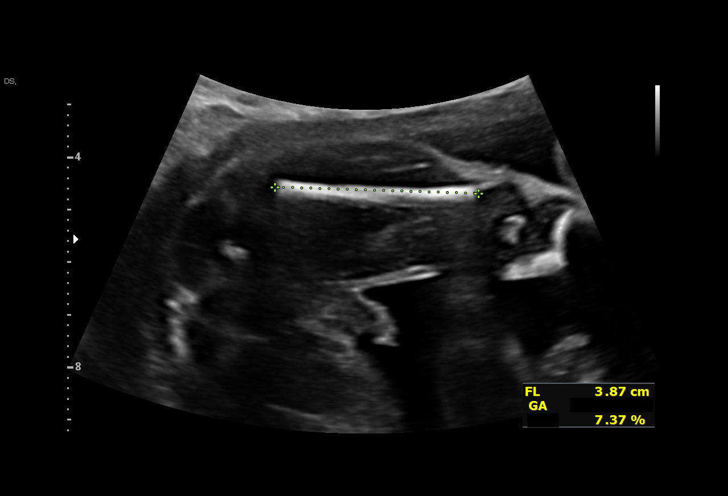
[im 19/25]
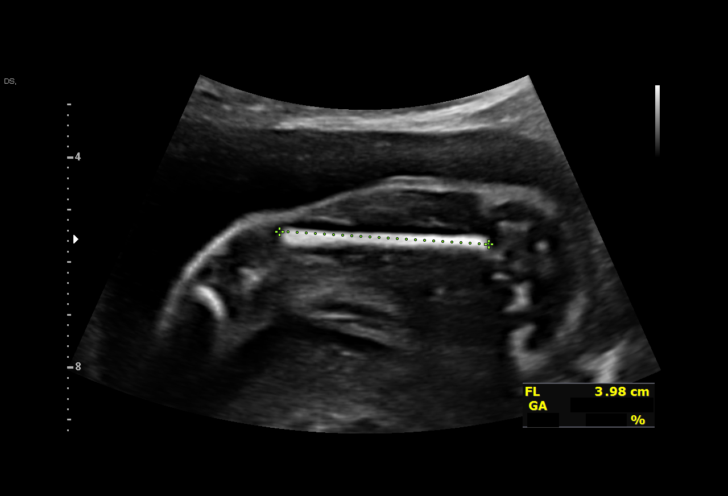
[im 21/25]
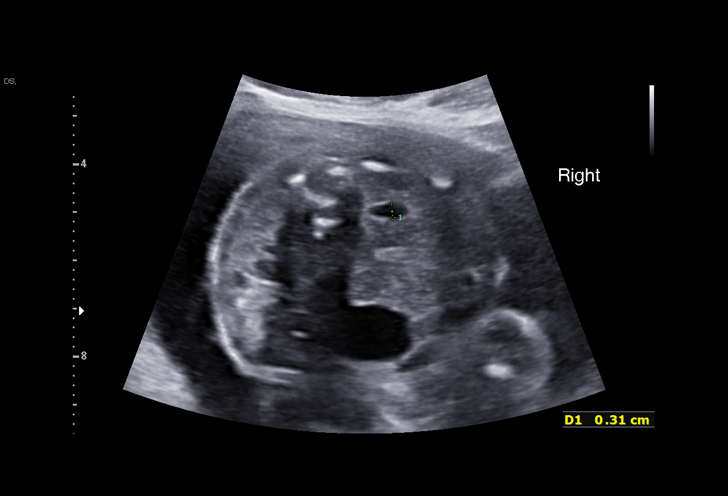
[im 23/25]
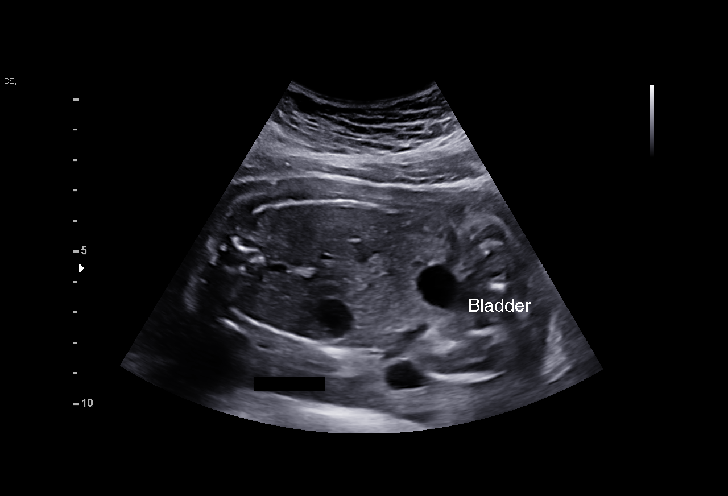
[im 25/25]
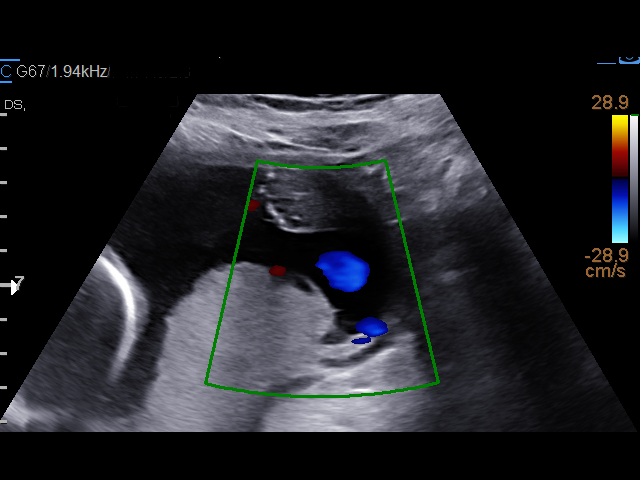

[14 of 25 positions shown; findings below may reference images not displayed]

EBBEY

                                                      ZEINAB

Indications

 23 weeks gestation of pregnancy
 Marginal insertion of umbilical cord affecting
 management of mother in second trimester
 Low risk NIPS/Neg AFP
 Obesity complicating pregnancy, second
 trimester (pregravid BMI 32)
 Encounter for other antenatal screening
 follow-up
Fetal Evaluation

 Num Of Fetuses:         1
 Cardiac Activity:       Observed
 Presentation:           Breech
 Placenta:               Posterior
 P. Cord Insertion:      Marginal insertion

 Amniotic Fluid
 AFI FV:      Within normal limits

                             Largest Pocket(cm)

Biometry
 BPD:        56  mm     G. Age:  23w 1d         22  %    CI:        73.16   %    70 - 86
                                                         FL/HC:      18.6   %    18.7 -
 HC:      208.1  mm     G. Age:  22w 6d         10  %    HC/AC:      1.08        1.05 -
 AC:      191.9  mm     G. Age:  23w 6d         48  %    FL/BPD:     69.3   %    71 - 87
 FL:       38.8  mm     G. Age:  22w 3d          8  %    FL/AC:      20.2   %    20 - 24

 Est. FW:     575  gm      1 lb 4 oz     21  %
OB History

 Gravidity:    1         Term:   0        Prem:   0        SAB:   0
 TOP:          0       Ectopic:  0        Living: 0
Gestational Age

 LMP:           23w 5d        Date:  06/12/20                 EDD:   03/19/21
 U/S Today:     23w 1d                                        EDD:   03/23/21
 Best:          23w 5d     Det. By:  LMP  (06/12/20)          EDD:   03/19/21
Anatomy

 Cranium:               Appears normal         LVOT:                   Previously seen
 Cavum:                 Previously seen        Aortic Arch:            Previously seen
 Ventricles:            Previously seen        Ductal Arch:            Appears normal
 Choroid Plexus:        Previously seen        Diaphragm:              Appears normal
 Cerebellum:            Previously seen        Stomach:                Previously Seen
 Posterior Fossa:       Previously seen        Abdomen:                Previously seen
 Nuchal Fold:           Previously seen        Abdominal Wall:         Previously seen
 Face:                  Orbits and profile     Cord Vessels:           Previously seen
                        previously seen
 Lips:                  Appears normal         Kidneys:                Previously seen
 Palate:                Previously seen        Bladder:                Previously seen
 Thoracic:              Previously seen        Spine:                  Previously seen
 Heart:                 Appears normal         Upper Extremities:      Previously seen
                        (4CH, axis, and
                        situs)
 RVOT:                  Previously seen        Lower Extremities:      Previously seen

 Other:  Heels/feet, open hands/5th digits, Lenses, Nasal bone visualized
         previously. Technically difficult due to maternal habitus and fetal
         position.
Cervix Uterus Adnexa

 Cervix
 Length:           4.07  cm.
 Normal appearance by transabdominal scan.
Impression

 Patient returned for completion of fetal anatomy .Amniotic
 fluid is normal and good fetal activity is seen .Fetal growth is
 appropriate for gestational age .Fetal anatomical survey was
 completed and appears normal.

 Marginal cord insertion is seen again. I explained the finding
 that marginal cord insertion is not usually associated with
 fetal adverse outcomes. However, in some cases, it can be
 associated with fetal growth restriction. We recommend serial
 fetal growth assessments till delivery.
Recommendations

 -An appointment was made for her to return in 5 weeks for
 fetal growth assessment.
                 Tiger, Ebadat

## 2022-03-18 IMAGING — US US MFM OB FOLLOW-UP
1 series · 14 of 27 positions shown · non-contrast
Comparison: none

[Series 1: us mfm ob follow-up · 27 acquisitions, 14 frames shown]
[im 1/27]
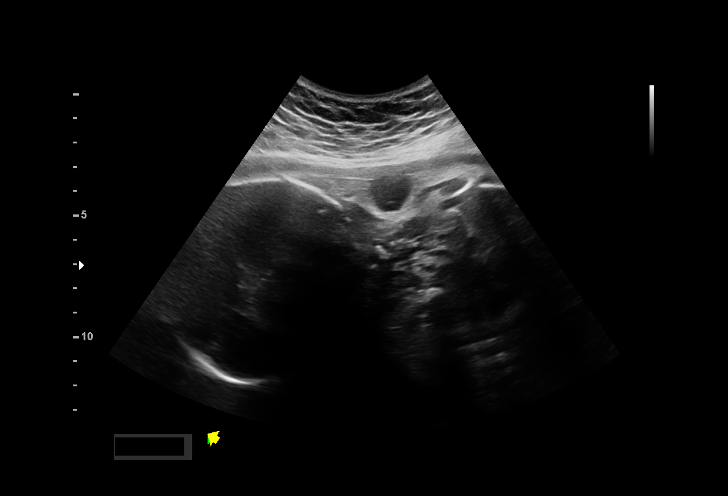
[im 3/27]
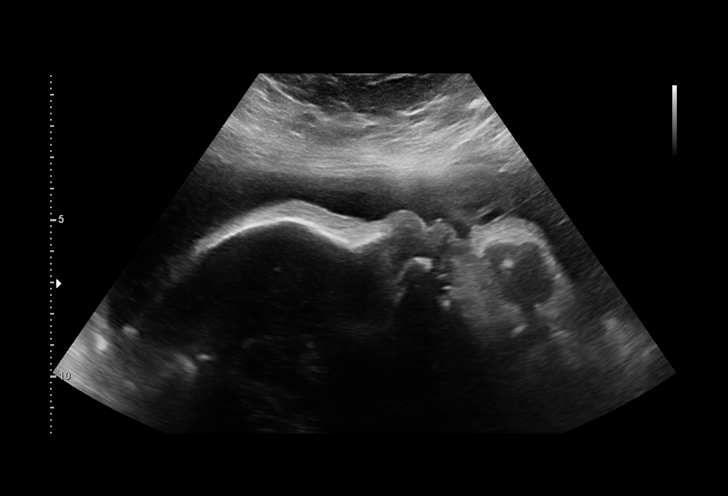
[im 5/27]
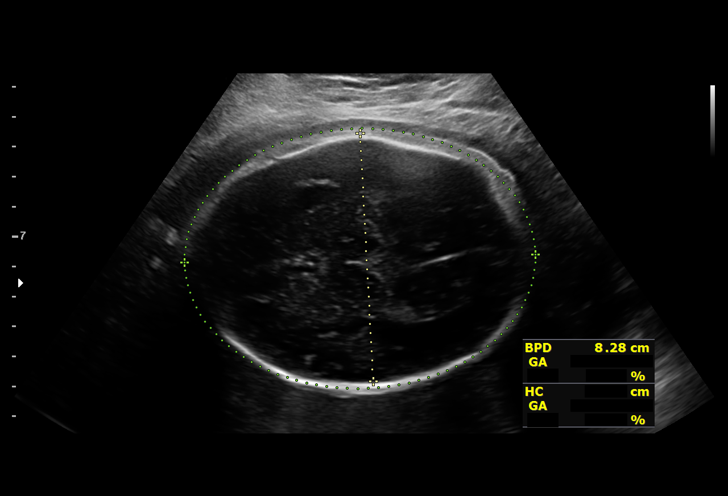
[im 7/27]
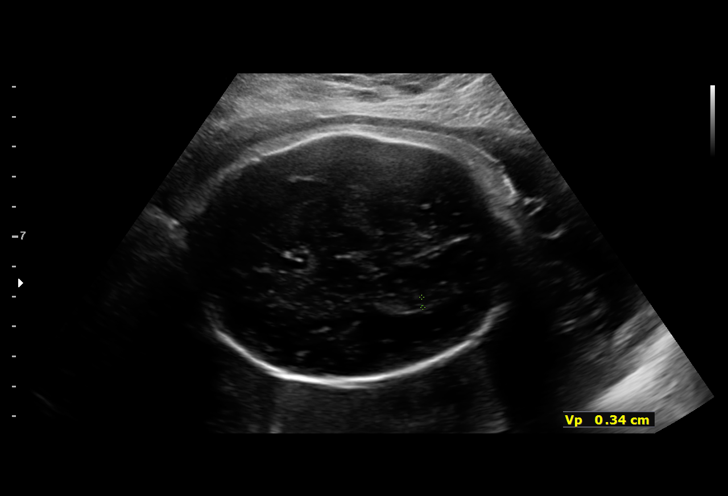
[im 9/27]
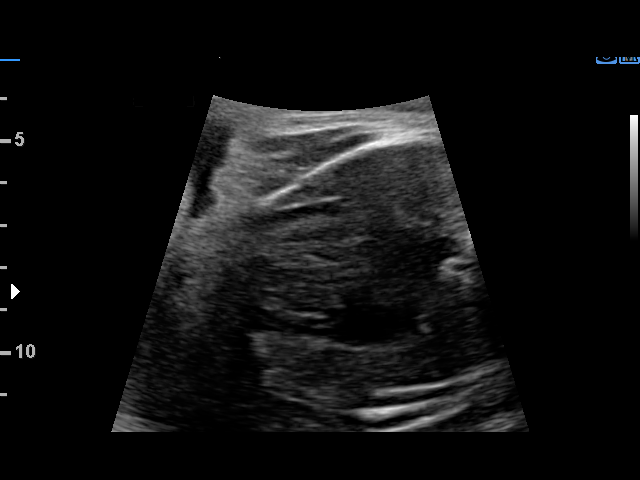
[im 11/27]
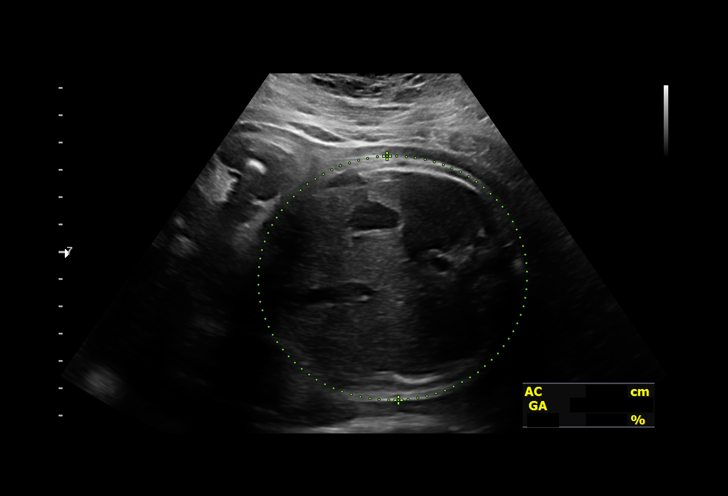
[im 13/27]
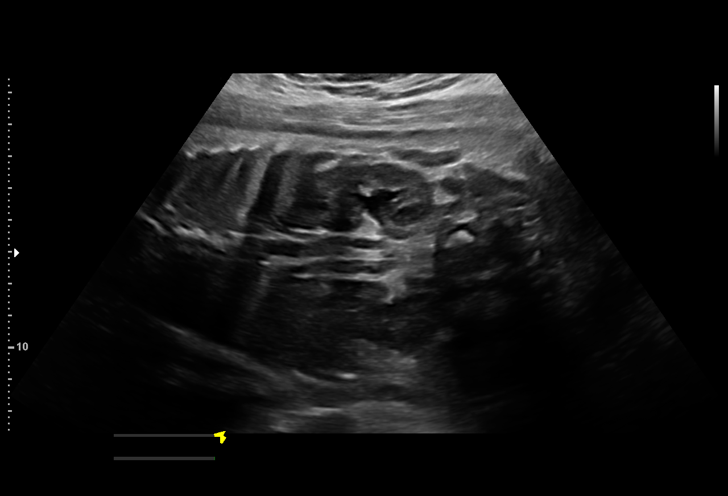
[im 15/27]
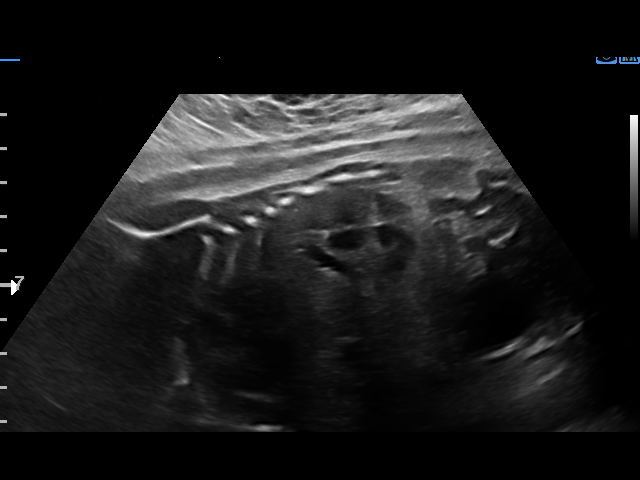
[im 17/27]
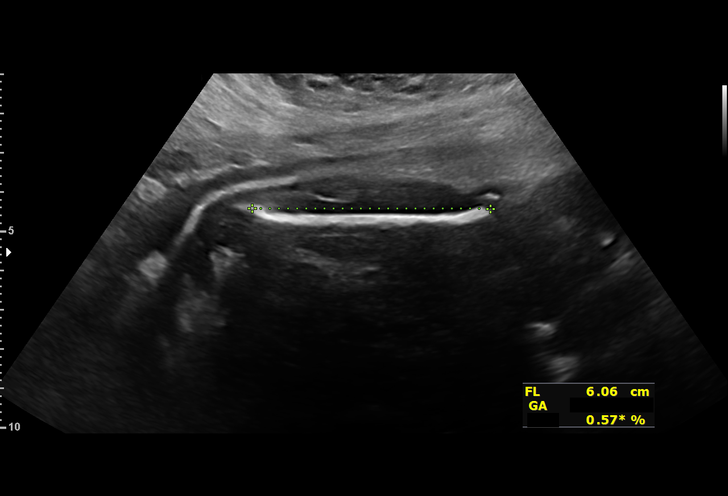
[im 19/27]
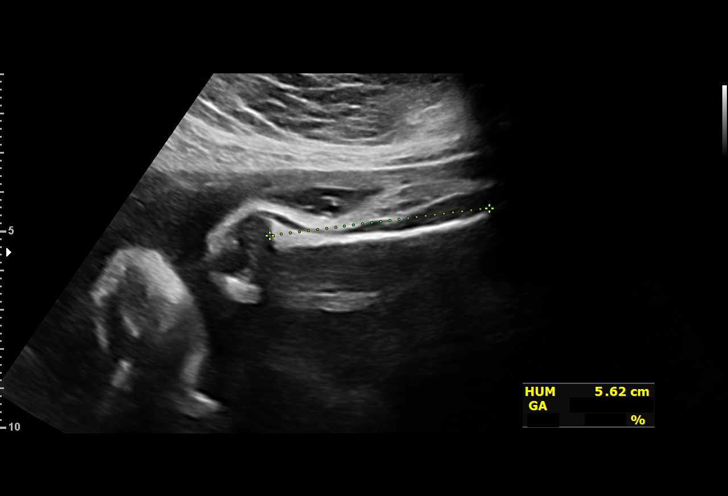
[im 21/27]
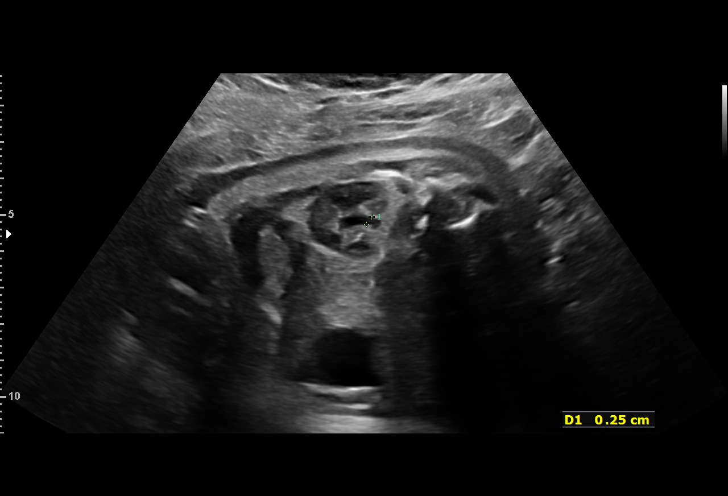
[im 23/27]
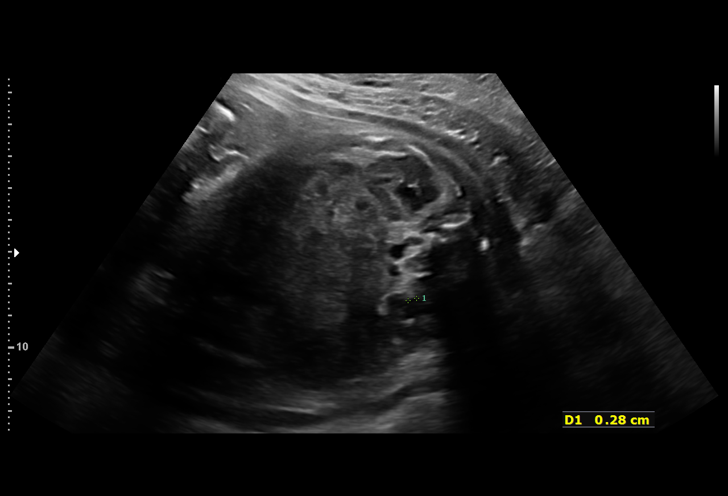
[im 25/27]
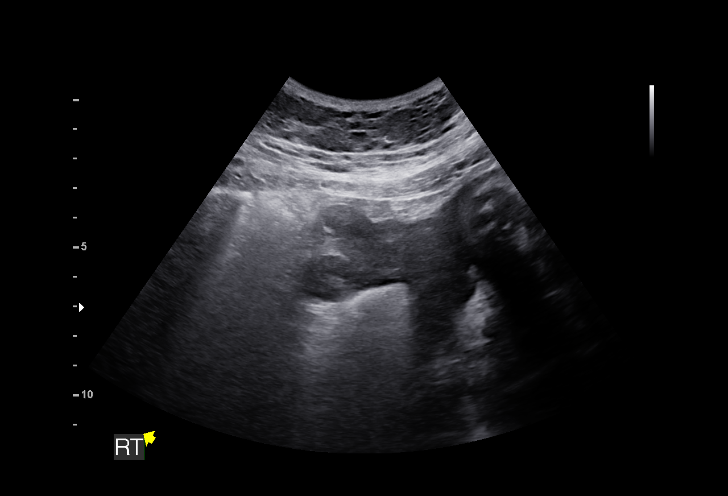
[im 27/27]
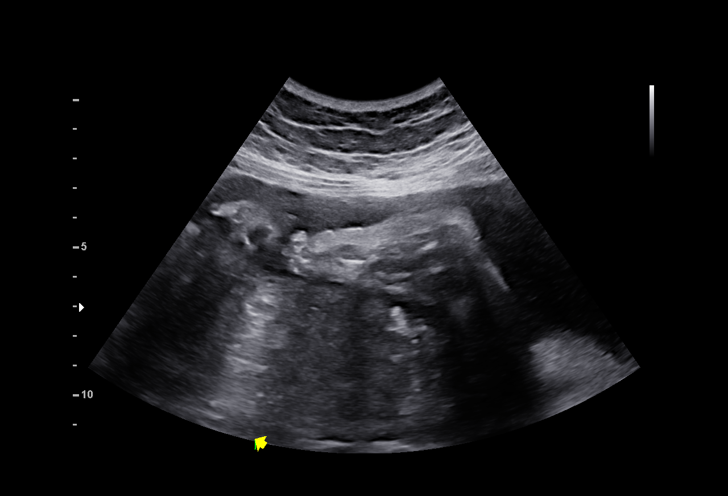

[14 of 27 positions shown; findings below may reference images not displayed]

MAGDA ODETTE


                                                      REZOLA

Indications

 34 weeks gestation of pregnancy
 Low risk NIPS/Neg AFP
 Obesity complicating pregnancy, third
 trimester (BMI 32)
 Marginal insertion of umbilical cord affecting
 management of mother in third trimester
Fetal Evaluation

 Num Of Fetuses:         1
 Fetal Heart Rate(bpm):  144
 Cardiac Activity:       Observed
 Presentation:           Breech
 Placenta:               Posterior
 P. Cord Insertion:      Marginal insertion- prev seen

 Amniotic Fluid
 AFI FV:      Within normal limits

 AFI Sum(cm)     %Tile       Largest Pocket(cm)
 15.17           55

 RUQ(cm)       RLQ(cm)       LUQ(cm)        LLQ(cm)

Biophysical Evaluation
 Amniotic F.V:   Within normal limits       F. Tone:        Observed
 F. Movement:    Observed                   Score:          [DATE]
 F. Breathing:   Observed
Biometry

 BPD:      82.4  mm     G. Age:  33w 1d         11  %    CI:        67.02   %    70 - 86
                                                         FL/HC:      19.2   %    20.1 -
 HC:      322.4  mm     G. Age:  36w 3d         59  %    HC/AC:      1.09        0.93 -
 AC:      295.1  mm     G. Age:  33w 3d         22  %    FL/BPD:     75.1   %    71 - 87
 FL:       61.9  mm     G. Age:  32w 1d        1.8  %    FL/AC:      21.0   %    20 - 24
 HUM:      57.6  mm     G. Age:  33w 3d         37  %

 LV:        3.4  mm
 Est. FW:    0539  gm    4 lb 13 oz      13  %
OB History

 Gravidity:    1         Term:   0        Prem:   0        SAB:   0
 TOP:          0       Ectopic:  0        Living: 0
Gestational Age

 LMP:           34w 5d        Date:  06/12/20                 EDD:   03/19/21
 U/S Today:     33w 6d                                        EDD:   03/25/21
 Best:          34w 5d     Det. By:  LMP  (06/12/20)          EDD:   03/19/21
Anatomy

 Cranium:               Appears normal         Aortic Arch:            Previously seen
 Cavum:                 Previously seen        Ductal Arch:            Previously seen
 Ventricles:            Appears normal         Diaphragm:              Previously seen
 Choroid Plexus:        Previously seen        Stomach:                Appears normal, left
                                                                       sided
 Cerebellum:            Previously seen        Abdomen:                Previously seen
 Posterior Fossa:       Previously seen        Abdominal Wall:         Previously seen
 Nuchal Fold:           Previously seen        Cord Vessels:           Previously seen
 Face:                  Orbits and profile     Kidneys:                Appear normal
                        previously seen
 Lips:                  Previously seen        Bladder:                Appears normal
 Thoracic:              Previously seen        Spine:                  Previously seen
 Heart:                 Appears normal         Upper Extremities:      Previously seen
                        (4CH, axis, and
                        situs)
 RVOT:                  Previously seen        Lower Extremities:      Previously seen
 LVOT:                  Previously seen

 Other:  Heels and 5th digit previously seen. VC, 3VV and 3VTV not visualized.
Cervix Uterus Adnexa

 Cervix
 Not visualized (advanced GA >67wks)

 Right Ovary
 Within normal limits.
 Left Ovary
 Within normal limits.
Impression

 Marginal cord insertion.
 Amniotic fluid is normal and good fetal activity is seen .Fetal
 growth is appropriate for gestational age .
 BREECH presentation.
Recommendations

 -An appointment was made for her to return in 3 weeks for
 fetal growth assessment and to check presentation.
                 Dara, Hiyou

## 2022-04-08 IMAGING — US US MFM OB FOLLOW-UP
1 series · 16 of 28 positions shown · non-contrast
Comparison: none

[Series 1: us mfm ob follow-up · 16 of 35 slices shown]
[im 1/35]
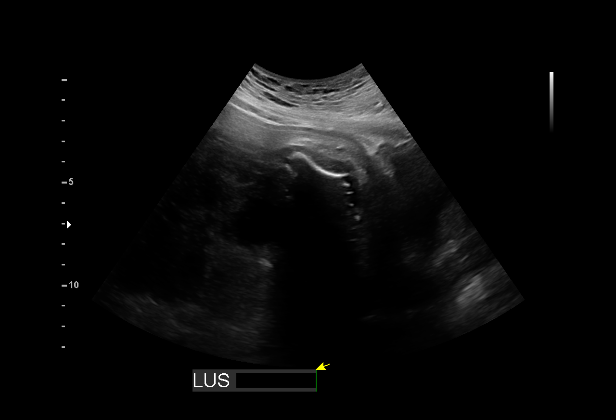
[im 3/35]
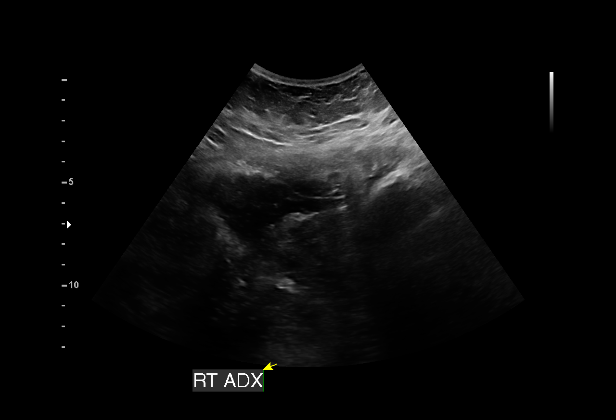
[im 6/35]
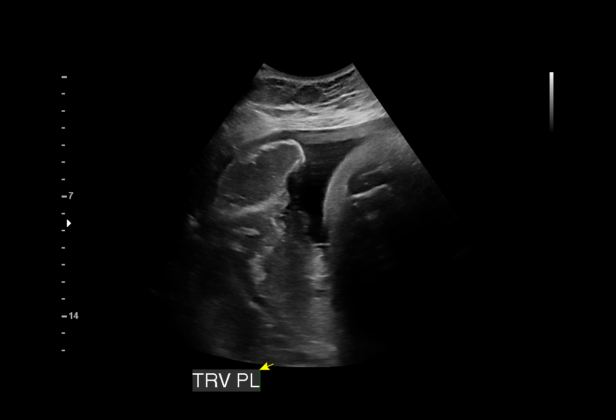
[im 8/35]
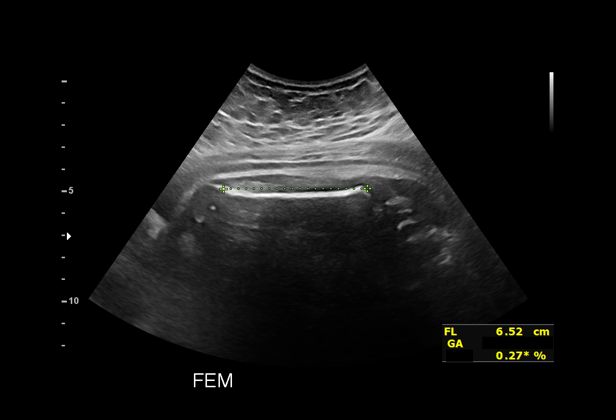
[im 9/35]
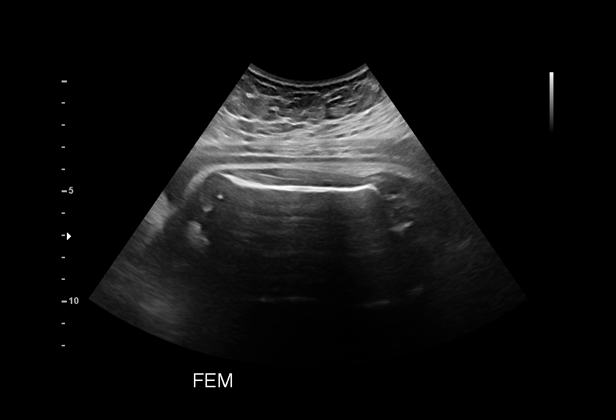
[im 12/35]
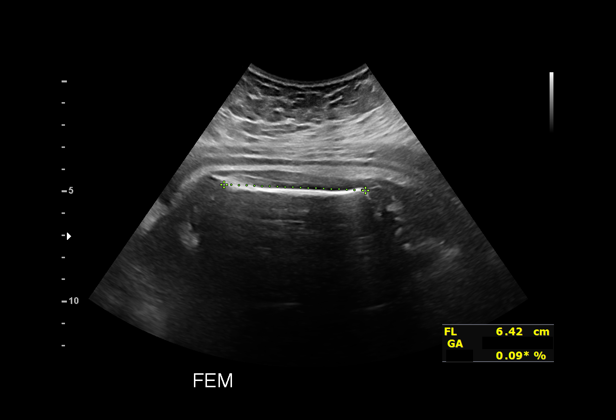
[im 14/35]
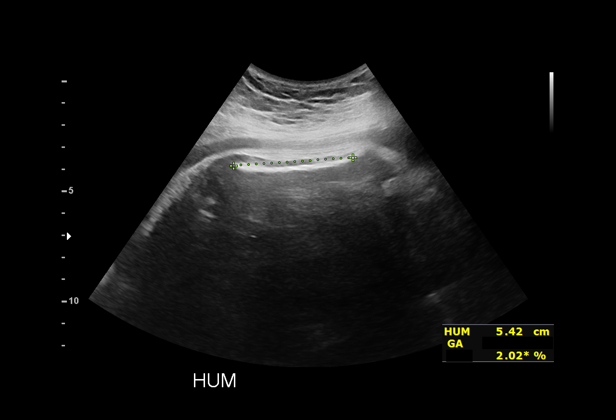
[im 17/35]
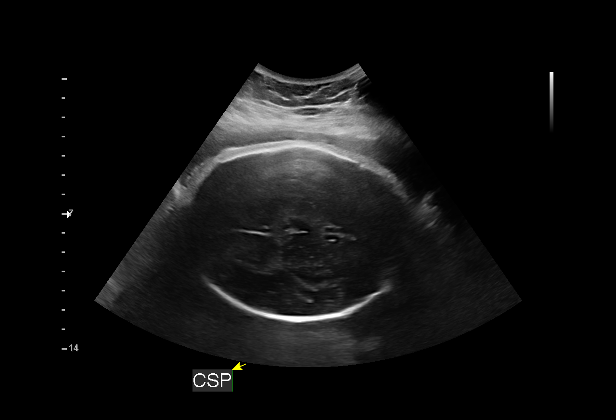
[im 18/35]
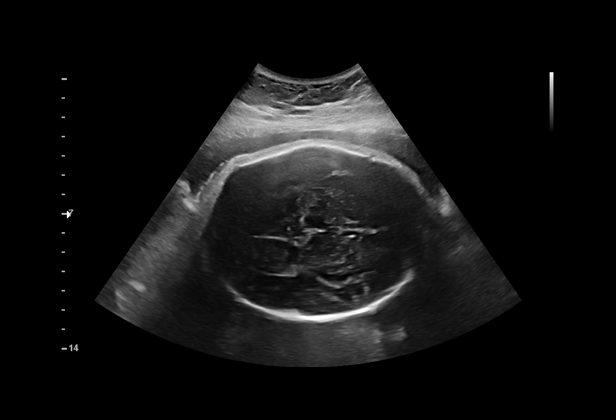
[im 21/35]
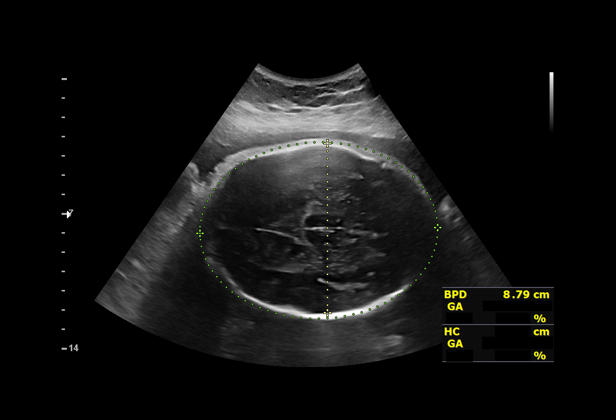
[im 23/35]
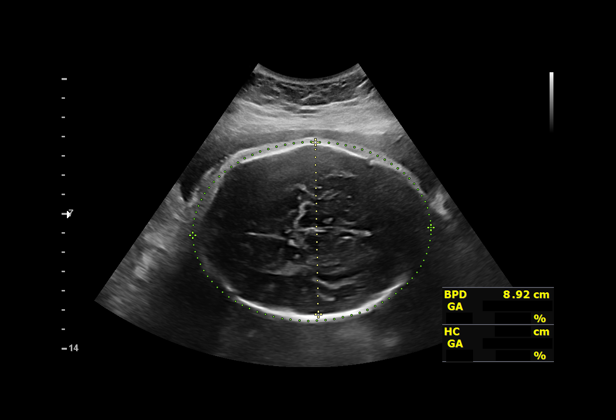
[im 26/35]
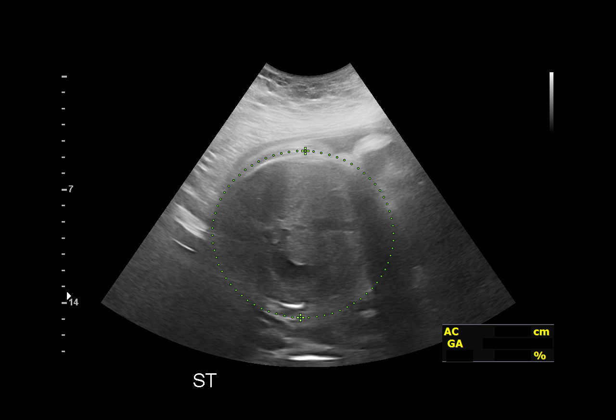
[im 27/35]
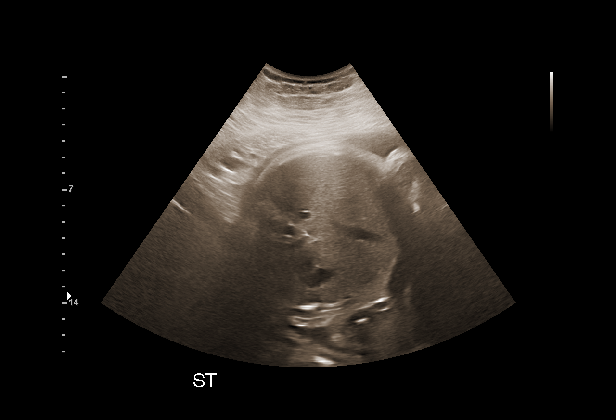
[im 29/35]
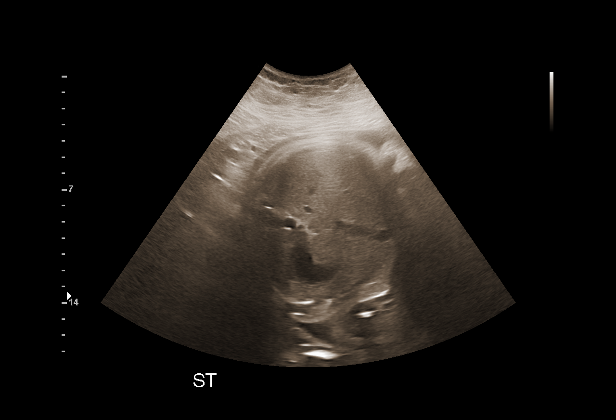
[im 32/35]
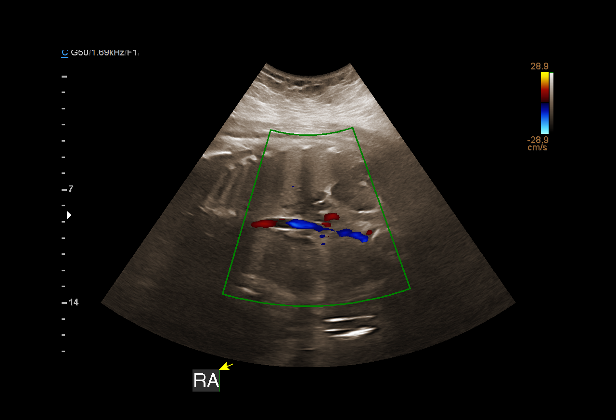
[im 35/35]
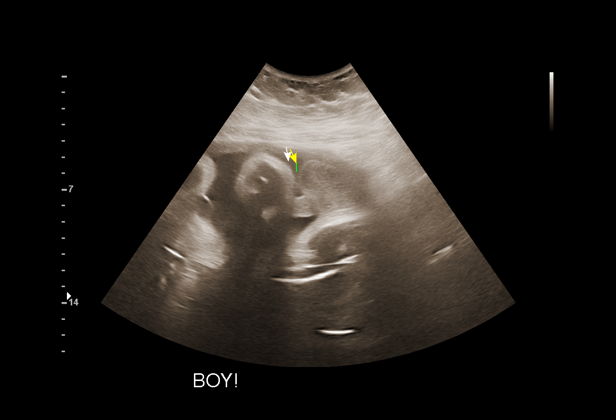

[16 of 28 positions shown; findings below may reference images not displayed]

Addendum:\.Anaam

             HUDOVIC

Indications

 37 weeks gestation of pregnancy
 Low risk NIPS/Neg AFP
 Obesity complicating pregnancy, third
 trimester (BMI 32)
 Marginal insertion of umbilical cord affecting
 management of mother in third trimester
Fetal Evaluation

 Num Of Fetuses:         1
 Preg. Location:         Intrauterine
 Fetal Heart Rate(bpm):  138
 Cardiac Activity:       Observed
 Presentation:           Breech
 Placenta:               Posterior
 P. Cord Insertion:      Prev Visualized (marginal)

 Amniotic Fluid
 AFI FV:      Within normal limits

 AFI Sum(cm)     %Tile       Largest Pocket(cm)
 13.88           52

 RUQ(cm)       RLQ(cm)       LUQ(cm)        LLQ(cm)
 4.5           4.06          5.32           0
Biometry
 BPD:      88.6  mm     G. Age:  35w 6d         20  %    CI:        68.12   %    70 - 86
                                                         FL/HC:      19.0   %    20.9 -
 HC:      343.3  mm     G. Age:  39w 4d         74  %    HC/AC:      1.00        0.92 -
 AC:      343.5  mm     G. Age:  38w 2d         80  %    FL/BPD:     73.5   %    71 - 87
 FL:       65.1  mm     G. Age:  33w 4d        < 1  %    FL/AC:      19.0   %    20 - 24
 HUM:      54.2  mm     G. Age:  31w 4d        < 5  %
 LV:        4.8  mm

 Est. FW:    6088  gm    6 lb 13 oz      42  %
OB History

 Gravidity:    1         Term:   0        Prem:   0        SAB:   0
 TOP:          0       Ectopic:  0        Living: 0
Gestational Age

 LMP:           37w 5d        Date:  06/12/20                 EDD:   03/19/21
 U/S Today:     36w 6d                                        EDD:   03/25/21
 Best:          37w 5d     Det. By:  LMP  (06/12/20)          EDD:   03/19/21
Anatomy

 Cranium:               Appears normal         Aortic Arch:            Previously seen
 Cavum:                 Appears normal         Ductal Arch:            Previously seen
 Ventricles:            Appears normal         Diaphragm:              Previously seen
 Choroid Plexus:        Previously seen        Stomach:                Appears normal, left
                                                                       sided
 Cerebellum:            Previously seen        Abdomen:                Previously seen
 Posterior Fossa:       Previously seen        Abdominal Wall:         Previously seen
 Nuchal Fold:           Previously seen        Cord Vessels:           Previously seen
 Face:                  Orbits and profile     Kidneys:                Appear normal
                        previously seen
 Lips:                  Previously seen        Bladder:                Appears normal
 Thoracic:              Previously seen        Spine:                  Previously seen
 Heart:                 Previously seen        Upper Extremities:      Previously seen
 RVOT:                  Previously seen        Lower Extremities:      Previously seen
 LVOT:                  Previously seen

 Other:  Heels and 5th digit previously seen. VC, 3VV and 3VTV not visualized.
Cervix Uterus Adnexa

 Cervix
 Not visualized (advanced GA >21wks)

 Uterus
 No abnormality visualized.

 Right Ovary
 Not visualized.

 Left Ovary
 Not visualized.
 Cul De Sac
 No free fluid seen.

 Adnexa
 No abnormality visualized.
Impression

 Follow up growth for marginal cord insertion.
 Normal interval growth with measurements consistent with
 dates
 Good fetal movement and amniotic fluid volume

 Ms. Sumaira fetus is breech today. We discussed the
 option of a ECV vs. primary cesarean delivery.
Recommendations

 Follow up as clinically indicated.

*** End of Addendum ***\.Anaam

             HUDOVIC

Indications

 37 weeks gestation of pregnancy
 Low risk NIPS/Neg AFP
 Obesity complicating pregnancy, third
 trimester (BMI 32)
 Marginal insertion of umbilical cord affecting
 management of mother in third trimester
Fetal Evaluation

 Num Of Fetuses:         1
 Preg. Location:         Intrauterine
 Fetal Heart Rate(bpm):  138
 Cardiac Activity:       Observed
 Presentation:           Breech
 Placenta:               Posterior
 P. Cord Insertion:      Prev Visualized (marginal)

 Amniotic Fluid
 AFI FV:      Within normal limits

 AFI Sum(cm)     %Tile       Largest Pocket(cm)
 13.88           52

 RUQ(cm)       RLQ(cm)       LUQ(cm)        LLQ(cm)
 4.5           4.06          5.32           0
Biometry
 BPD:      88.6  mm     G. Age:  35w 6d         20  %    CI:        68.12   %    70 - 86
                                                         FL/HC:      19.0   %    20.9 -
 HC:      343.3  mm     G. Age:  39w 4d         74  %    HC/AC:      1.00        0.92 -
 AC:      343.5  mm     G. Age:  38w 2d         80  %    FL/BPD:     73.5   %    71 - 87
 FL:       65.1  mm     G. Age:  33w 4d        < 1  %    FL/AC:      19.0   %    20 - 24
 HUM:      54.2  mm     G. Age:  31w 4d        < 5  %
 LV:        4.8  mm

 Est. FW:    6088  gm    6 lb 13 oz      42  %
OB History

 Gravidity:    1         Term:   0        Prem:   0        SAB:   0
 TOP:          0       Ectopic:  0        Living: 0
Gestational Age

 LMP:           37w 5d        Date:  06/12/20                 EDD:   03/19/21
 U/S Today:     36w 6d                                        EDD:   03/25/21
 Best:          37w 5d     Det. By:  LMP  (06/12/20)          EDD:   03/19/21
Anatomy

 Cranium:               Appears normal         Aortic Arch:            Previously seen
 Cavum:                 Appears normal         Ductal Arch:            Previously seen
 Ventricles:            Appears normal         Diaphragm:              Previously seen
 Choroid Plexus:        Previously seen        Stomach:                Appears normal, left
                                                                       sided
 Cerebellum:            Previously seen        Abdomen:                Previously seen
 Posterior Fossa:       Previously seen        Abdominal Wall:         Previously seen
 Nuchal Fold:           Previously seen        Cord Vessels:           Previously seen
 Face:                  Orbits and profile     Kidneys:                Appear normal
                        previously seen
 Lips:                  Previously seen        Bladder:                Appears normal
 Thoracic:              Previously seen        Spine:                  Previously seen
 Heart:                 Previously seen        Upper Extremities:      Previously seen
 RVOT:                  Previously seen        Lower Extremities:      Previously seen
 LVOT:                  Previously seen

 Other:  Heels and 5th digit previously seen. VC, 3VV and 3VTV not visualized.
Cervix Uterus Adnexa

 Cervix
 Not visualized (advanced GA >21wks)

 Uterus
 No abnormality visualized.

 Right Ovary
 Not visualized.

 Left Ovary
 Not visualized.
 Cul De Sac
 No free fluid seen.

 Adnexa
 No abnormality visualized.
Impression

 Follow up growth for marginal cord insertion.
 Normal interval growth with measurements consistent with
 dates
 Good fetal movement and amniotic fluid volume
Recommendations

 Follow up as clinically indicated.

## 2022-05-21 ENCOUNTER — Ambulatory Visit
Admission: EM | Admit: 2022-05-21 | Discharge: 2022-05-21 | Disposition: A | Discharge status: Home / Self Care | Payer: Commercial Managed Care - HMO | Attending: Physician Assistant | Admitting: Physician Assistant

## 2022-05-21 DIAGNOSIS — B029 Zoster without complications: Secondary | ICD-10-CM | POA: Diagnosis not present

## 2022-05-21 MED ORDER — VALACYCLOVIR HCL 1 G PO TABS: 1000.0000 mg | ORAL_TABLET | Freq: Three times a day (TID) | ORAL | 0 refills | Status: AC

## 2022-05-21 NOTE — ED Provider Notes (Signed)
[facility]    CSN: [id number] Arrival date & time: 05/21/22  1025      History   Chief Complaint Chief Complaint  Patient presents with   Rash    HPI Belinda May is a 21 y.o. female.   Patient here today for evaluation of a clustered rash to the upper part of her left back that started about 4 days ago.  She reports that rash is painful and is causing some pain in her left shoulder and down her left arm.  She describes the pain as a burning sensation.  She has not any fever.  She does not have rash elsewhere.  She did have chickenpox as a child.  The history is provided by the patient.  Rash Associated symptoms: no fever, no nausea and not vomiting     Past Medical History:  Diagnosis Date   PCOS (polycystic ovarian syndrome)     Patient Active Problem List   Diagnosis Date Noted   IUD (intrauterine device) in place 06/29/2021   Biological false positive RPR test 03/16/2021   S/P cesarean section 03/12/2021   History of appendectomy 08/26/2020   PCOS (polycystic ovarian syndrome)     Past Surgical History:  Procedure Laterality Date   APPENDECTOMY  2012   Pt reports 2012 or 2013   CESAREAN SECTION N/A 03/12/2021   Procedure: CESAREAN SECTION;  Surgeon: Tereso Newcomer, MD;  Location: [facility];  Service: Obstetrics;  Laterality: N/A;  Primary C/S Breech Presentation SROM    OB History     Gravida  1   Para  1   Term  1   Preterm  0   AB  0   Living  1      SAB  0   IAB  0   Ectopic  0   Multiple  0   Live Births  1            Home Medications    Prior to Admission medications   Medication Sig Start Date End Date Taking? Authorizing Provider  valACYclovir (VALTREX) 1000 MG tablet Take 1 tablet (1,000 mg total) by mouth 3 (three) times daily. 05/21/22  Yes Tomi Bamberger, PA-C  ibuprofen (ADVIL) 600 MG tablet Take 1 tablet (600 mg total) by mouth every 6 (six) hours. 03/14/21   Gita Kudo, MD    Family  History Family History  Problem Relation Age of Onset   Uterine cancer Mother    Diabetes Maternal Grandmother    Diabetes Paternal Grandmother     Social History Social History   Tobacco Use   Smoking status: Never   Smokeless tobacco: Never  Vaping Use   Vaping Use: Never used  Substance Use Topics   Alcohol use: Never   Drug use: Never     Allergies   Tylenol [acetaminophen]   Review of Systems Review of Systems  Constitutional:  Negative for chills and fever.  Eyes:  Negative for discharge and redness.  Gastrointestinal:  Negative for nausea and vomiting.  Skin:  Positive for rash.     Physical Exam Triage Vital Signs ED Triage Vitals  Enc Vitals Group     BP 05/21/22 1135 109/69     Pulse Rate 05/21/22 1135 78     Resp 05/21/22 1135 18     Temp 05/21/22 1135 98.3 F (36.8 C)     Temp Source 05/21/22 1135 Oral     SpO2 05/21/22 1135 98 %  Weight --      Height --      Head Circumference --      Peak Flow --      Pain Score 05/21/22 1138 6     Pain Loc --      Pain Edu? --      Excl. in GC? --    No data found.  Updated Vital Signs BP 109/69 (BP Location: Left Arm)   Pulse 78   Temp 98.3 F (36.8 C) (Oral)   Resp 18   LMP  (LMP Unknown)   SpO2 98%      Physical Exam Vitals and nursing note reviewed.  Constitutional:      General: She is not in acute distress.    Appearance: Normal appearance. She is not ill-appearing.  HENT:     Head: Normocephalic and atraumatic.  Eyes:     Conjunctiva/sclera: Conjunctivae normal.  Cardiovascular:     Rate and Rhythm: Normal rate.  Pulmonary:     Effort: Pulmonary effort is normal.  Skin:    Comments: Mildly erythematous clustered vesicular lesions to left upper back, does not cross midline, no other rash/ skin lesions noted  Neurological:     Mental Status: She is alert.  Psychiatric:        Mood and Affect: Mood normal.        Behavior: Behavior normal.        Thought Content: Thought  content normal.      UC Treatments / Results  Labs (all labs ordered are listed, but only abnormal results are displayed) Labs Reviewed - No data to display  EKG   Radiology No results found.  Procedures Procedures (including critical care time)  Medications Ordered in UC Medications - No data to display  Initial Impression / Assessment and Plan / UC Course  I have reviewed the triage vital signs and the nursing notes.  Pertinent labs & imaging results that were available during my care of the patient were reviewed by me and considered in my medical decision making (see chart for details).    Suspect most likely shingles and will treat with Valtrex.  Recommended ibuprofen as needed for pain and encouraged follow-up if no gradual improvement or with any further concerns.  Final Clinical Impressions(s) / UC Diagnoses   Final diagnoses:  Herpes zoster without complication   Discharge Instructions   None    ED Prescriptions     Medication Sig Dispense Auth. Provider   valACYclovir (VALTREX) 1000 MG tablet Take 1 tablet (1,000 mg total) by mouth 3 (three) times daily. 21 tablet Tomi Bamberger, PA-C      PDMP not reviewed this encounter.   Tomi Bamberger, PA-C 05/21/22 1222

## 2022-05-21 NOTE — ED Triage Notes (Signed)
Pt presents with clustered rash area on upper part of back that is causing pain on left side of shoulder and down left arm X 4 days.

## 2022-08-16 ENCOUNTER — Emergency Department (HOSPITAL_COMMUNITY)
Admission: EM | Admit: 2022-08-16 | Discharge: 2022-08-16 | Disposition: A | Discharge status: Home / Self Care | Payer: Worker's Compensation | Attending: Emergency Medicine | Admitting: Emergency Medicine

## 2022-08-16 ENCOUNTER — Emergency Department (HOSPITAL_COMMUNITY): Payer: Worker's Compensation

## 2022-08-16 ENCOUNTER — Other Ambulatory Visit: Payer: Self-pay

## 2022-08-16 DIAGNOSIS — R1030 Lower abdominal pain, unspecified: Secondary | ICD-10-CM

## 2022-08-16 DIAGNOSIS — K625 Hemorrhage of anus and rectum: Secondary | ICD-10-CM | POA: Diagnosis not present

## 2022-08-16 DIAGNOSIS — K922 Gastrointestinal hemorrhage, unspecified: Secondary | ICD-10-CM

## 2022-08-16 LAB — I-STAT CHEM 8, ED
BUN: 12 mg/dL (ref 6–20)
Calcium, Ion: 1.19 mmol/L (ref 1.15–1.40)
Chloride: 107 mmol/L (ref 98–111)
Creatinine, Ser: 0.6 mg/dL (ref 0.44–1.00)
Glucose, Bld: 85 mg/dL (ref 70–99)
HCT: 36 % (ref 36.0–46.0)
Hemoglobin: 12.2 g/dL (ref 12.0–15.0)
Potassium: 4 mmol/L (ref 3.5–5.1)
Sodium: 141 mmol/L (ref 135–145)
TCO2: 25 mmol/L (ref 22–32)

## 2022-08-16 LAB — COMPREHENSIVE METABOLIC PANEL
ALT: 28 U/L (ref 0–44)
AST: 21 U/L (ref 15–41)
Albumin: 4.2 g/dL (ref 3.5–5.0)
Alkaline Phosphatase: 69 U/L (ref 38–126)
Anion gap: 9 (ref 5–15)
BUN: 12 mg/dL (ref 6–20)
CO2: 22 mmol/L (ref 22–32)
Calcium: 9.3 mg/dL (ref 8.9–10.3)
Chloride: 108 mmol/L (ref 98–111)
Creatinine, Ser: 0.68 mg/dL (ref 0.44–1.00)
GFR, Estimated: >60 mL/min (ref >60)
Glucose, Bld: 93 mg/dL (ref 70–99)
Potassium: 4.5 mmol/L (ref 3.5–5.1)
Sodium: 139 mmol/L (ref 135–145)
Total Bilirubin: 0.8 mg/dL (ref 0.3–1.2)
Total Protein: 7.4 g/dL (ref 6.5–8.1)

## 2022-08-16 LAB — I-STAT BETA HCG BLOOD, ED (MC, WL, AP ONLY): I-stat hCG, quantitative: <5 m[IU]/mL (ref <5)

## 2022-08-16 LAB — CBC
HCT: 41.8 % (ref 36.0–46.0)
Hemoglobin: 14.1 g/dL (ref 12.0–15.0)
MCH: 30.1 pg (ref 26.0–34.0)
MCHC: 33.7 g/dL (ref 30.0–36.0)
MCV: 89.3 fL (ref 80.0–100.0)
Platelets: 237 10*3/uL (ref 150–400)
RBC: 4.68 MIL/uL (ref 3.87–5.11)
RDW: 12.6 % (ref 11.5–15.5)
WBC: 8.9 10*3/uL (ref 4.0–10.5)
nRBC: 0 % (ref 0.0–0.2)

## 2022-08-16 LAB — TYPE AND SCREEN
ABO/RH(D): O POS
Antibody Screen: NEGATIVE

## 2022-08-16 LAB — URINALYSIS, ROUTINE W REFLEX MICROSCOPIC
Bilirubin Urine: NEGATIVE
Glucose, UA: NEGATIVE mg/dL
Hgb urine dipstick: NEGATIVE
Ketones, ur: NEGATIVE mg/dL
Leukocytes,Ua: NEGATIVE
Nitrite: NEGATIVE
Protein, ur: NEGATIVE mg/dL
Specific Gravity, Urine: 1.02 (ref 1.005–1.030)
pH: 5 (ref 5.0–8.0)

## 2022-08-16 LAB — LIPASE, BLOOD: Lipase: 29 U/L (ref 11–51)

## 2022-08-16 MED ORDER — FENTANYL CITRATE PF 50 MCG/ML IJ SOSY
50.0000 ug | PREFILLED_SYRINGE | INTRAMUSCULAR | Status: DC | PRN
  Administered 2022-08-16: 50 ug via INTRAVENOUS
  Filled 2022-08-16: qty 1

## 2022-08-16 MED ORDER — IOHEXOL 350 MG/ML SOLN
75.0000 mL | Freq: Once | INTRAVENOUS | Status: AC | PRN
  Administered 2022-08-16: 75 mL via INTRAVENOUS

## 2022-08-16 MED ORDER — MORPHINE SULFATE (PF) 4 MG/ML IV SOLN: 4.0000 mg | Freq: Once | INTRAVENOUS | Status: DC

## 2022-08-16 MED ORDER — SODIUM CHLORIDE 0.9 % IV BOLUS
1000.0000 mL | Freq: Once | INTRAVENOUS | Status: AC
  Administered 2022-08-16: 1000 mL via INTRAVENOUS

## 2022-08-16 NOTE — Discharge Instructions (Addendum)
Follow-up closely with gastroenterology.  Avoid ibuprofen at this time. Your CT looked okay except fatty liver. Minimize saturated fatty foods or processed foods.  Call tomorrow for soonest available appointment.  Return for uncontrolled bleeding, feeling like you are going to pass out, extreme weakness, fevers or new concerns.

## 2022-08-16 NOTE — ED Provider Notes (Signed)
[facility] EMERGENCY DEPARTMENT Provider Note   CSN: [id number] Arrival date & time: 08/16/22  1058     History  Chief Complaint  Patient presents with   Rectal Bleeding    Belinda May is a 21 y.o. female.  Patient complains of almost 3 days of intermittent rectal bleeding dark red color mixed with stools.  No history of similar.  No colonoscopy history.  No known concerning family history.  Patient has lower abdominal tenderness worse in the left.  No history of known diverticulitis or colitis.  No blood thinner use.  No fevers or chills.  History of appendectomy.  Patient overall feels well.  Patient did have a grocery cart banging into her lower abdomen last Tuesday unsure if related.       Home Medications Prior to Admission medications   Medication Sig Start Date End Date Taking? Authorizing Provider  ibuprofen (ADVIL) 600 MG tablet Take 1 tablet (600 mg total) by mouth every 6 (six) hours. 03/14/21   Gita Kudo, MD  valACYclovir (VALTREX) 1000 MG tablet Take 1 tablet (1,000 mg total) by mouth 3 (three) times daily. 05/21/22   Tomi Bamberger, PA-C      Allergies    Tylenol [acetaminophen]    Review of Systems   Review of Systems  Constitutional:  Negative for chills and fever.  HENT:  Negative for congestion.   Eyes:  Negative for visual disturbance.  Respiratory:  Negative for shortness of breath.   Cardiovascular:  Negative for chest pain.  Gastrointestinal:  Positive for blood in stool. Negative for abdominal pain and vomiting.  Genitourinary:  Negative for dysuria and flank pain.  Musculoskeletal:  Negative for back pain, neck pain and neck stiffness.  Skin:  Negative for rash.  Neurological:  Negative for light-headedness and headaches.    Physical Exam Updated Vital Signs BP 103/69   Pulse 95   Temp 98.3 F (36.8 C) (Oral)   Resp 16   SpO2 100%  Physical Exam Vitals and nursing note reviewed.  Constitutional:       General: She is not in acute distress.    Appearance: She is well-developed.  HENT:     Head: Normocephalic and atraumatic.     Mouth/Throat:     Mouth: Mucous membranes are moist.  Eyes:     General:        Right eye: No discharge.        Left eye: No discharge.     Conjunctiva/sclera: Conjunctivae normal.  Neck:     Trachea: No tracheal deviation.  Cardiovascular:     Rate and Rhythm: Normal rate.  Pulmonary:     Effort: Pulmonary effort is normal.  Abdominal:     General: There is no distension.     Palpations: Abdomen is soft.     Tenderness: There is abdominal tenderness (lower abd especially left lower). There is no guarding.  Musculoskeletal:     Cervical back: Normal range of motion and neck supple. No rigidity.  Skin:    General: Skin is warm.     Capillary Refill: Capillary refill takes less than 2 seconds.     Findings: No rash.  Neurological:     General: No focal deficit present.     Mental Status: She is alert.     Cranial Nerves: No cranial nerve deficit.  Psychiatric:        Mood and Affect: Mood normal.     ED Results / Procedures /  Treatments   Labs (all labs ordered are listed, but only abnormal results are displayed) Labs Reviewed  URINALYSIS, ROUTINE W REFLEX MICROSCOPIC - Abnormal; Notable for the following components:      Result Value   APPearance HAZY (*)    All other components within normal limits  COMPREHENSIVE METABOLIC PANEL  CBC  LIPASE, BLOOD  I-STAT BETA HCG BLOOD, ED ([facility], [facility], [facility] ONLY)  I-STAT CHEM 8, ED  I-STAT BETA HCG BLOOD, ED ([facility], [facility], [facility] ONLY)  TYPE AND SCREEN    EKG None  Radiology CT ABDOMEN PELVIS W CONTRAST  Result Date: 08/16/2022 CLINICAL DATA:  Left lower quadrant abdominal pain. EXAM: CT ABDOMEN AND PELVIS WITH CONTRAST TECHNIQUE: Multidetector CT imaging of the abdomen and pelvis was performed using the standard protocol following bolus administration of intravenous contrast. RADIATION DOSE REDUCTION: This exam  was performed according to the departmental dose-optimization program which includes automated exposure control, adjustment of the mA and/or kV according to patient size and/or use of iterative reconstruction technique. CONTRAST:  21mL OMNIPAQUE IOHEXOL 350 MG/ML SOLN COMPARISON:  None Available. FINDINGS: Lower chest: The visualized lung bases are clear. No intra-abdominal free air or free fluid. Hepatobiliary: Fatty liver. No biliary ductal dilatation. The gallbladder is unremarkable. Pancreas: Unremarkable. No pancreatic ductal dilatation or surrounding inflammatory changes. Spleen: Normal in size without focal abnormality. Adrenals/Urinary Tract: Adrenal glands are unremarkable. Kidneys are normal, without renal calculi, focal lesion, or hydronephrosis. Bladder is unremarkable. Stomach/Bowel: Small hiatal hernia. There is no bowel obstruction or active inflammation. The appendix is not visualized with certainty. No inflammatory changes identified in the right lower quadrant. Vascular/Lymphatic: The abdominal aorta and IVC are unremarkable. No portal venous gas. There is no adenopathy. Reproductive: The uterus is anteverted. An intrauterine device is noted. No adnexal masses. Other: None Musculoskeletal: No acute or significant osseous findings. IMPRESSION: 1. No acute intra-abdominal or pelvic pathology. 2. Fatty liver. Electronically Signed   By: Elgie Collard M.D.   On: 08/16/2022 19:04    Procedures Procedures    Medications Ordered in ED Medications  fentaNYL (SUBLIMAZE) injection 50 mcg (50 mcg Intravenous Given 08/16/22 1638)  sodium chloride 0.9 % bolus 1,000 mL (0 mLs Intravenous Stopped 08/16/22 1758)  iohexol (OMNIPAQUE) 350 MG/ML injection 75 mL (75 mLs Intravenous Contrast Given 08/16/22 1840)    ED Course/ Medical Decision Making/ A&P                           Medical Decision Making Amount and/or Complexity of Data Reviewed Labs: ordered.  Risk Prescription drug  management.   Well-appearing patient presents with lower abdominal pain and intermittent blood in the stools differential includes diverticulitis, colitis, internal hemorrhoid, traumatic less likely given lower mechanism last Tuesday, other.  Plan for general blood work independently reviewed and hemoglobin normal, electrolytes unremarkable.  No active bleeding in the ER.  Pain meds and IV fluid bolus ordered.  Plan for CT scan with contrast for further delineation.  Pain improved on reassessment.  CT scan results delayed due to waiting for pregnancy test pregnancy test negative.  CT scan results reviewed no acute abnormalities, fatty liver.  Patient stable for outpatient follow-up.        Final Clinical Impression(s) / ED Diagnoses Final diagnoses:  Acute GI bleeding  Lower abdominal pain    Rx / DC Orders ED Discharge Orders     None         Blane Ohara, MD  08/17/22 0012  

## 2022-08-16 NOTE — ED Triage Notes (Signed)
Patient sent to ED for further evaluation of abdominal pain and rectal bleeding that started a few days ago. Patient also complains of nausea.

## 2022-08-16 NOTE — ED Notes (Signed)
Patient verbalizes understanding of discharge instructions. Opportunity for questioning and answers were provided. Armband removed by staff, pt discharged from ED. Ambulated out to lobby with family ? ?

## 2022-08-16 NOTE — ED Provider Triage Note (Signed)
Emergency Medicine Provider Triage Evaluation Note  Belinda May , a 21 y.o. female  was evaluated in triage.  Pt complains of 3 days of rectal bleeding that is dark red and colored mixed in with stools.  She is also had some left lower quadrant tenderness has been ongoing for about a week or so.  Pain radiates down to the middle and right side of her abdomen as well.  She is not on blood thinners.  Never had symptoms like this before.  History of appendectomy.  Review of Systems  Positive:  Negative:   Physical Exam  BP 124/83 (BP Location: Right Arm)   Pulse 98   Temp 98.3 F (36.8 C) (Oral)   Resp 16   SpO2 99%  Gen:   Awake, no distress   Resp:  Normal effort  MSK:   Moves extremities without difficulty  Other:  + LLQ tenderness  Medical Decision Making  Medically screening exam initiated at 12:52 PM.  Appropriate orders placed.  Belinda May was informed that the remainder of the evaluation will be completed by another provider, this initial triage assessment does not replace that evaluation, and the importance of remaining in the ED until their evaluation is complete.     Belinda Leach, PA-C 08/16/22 1252

## 2022-09-21 ENCOUNTER — Encounter: Payer: Self-pay | Admitting: Gastroenterology

## 2022-09-24 NOTE — Telephone Encounter (Signed)
Patient was seen in the ER and not by our office. Patient needs to contact the billing department.

## 2022-12-12 ENCOUNTER — Ambulatory Visit: Payer: Self-pay | Admitting: Gastroenterology

## 2023-01-10 ENCOUNTER — Ambulatory Visit: Payer: Self-pay | Admitting: Gastroenterology

## 2024-05-20 ENCOUNTER — Other Ambulatory Visit: Payer: Worker's Compensation

## 2024-05-20 ENCOUNTER — Ambulatory Visit: Payer: Worker's Compensation | Admitting: Orthopedic Surgery

## 2024-05-20 VITALS — BP 102/71 | HR 78 | Ht 62.0 in | Wt 249.6 lb

## 2024-05-20 DIAGNOSIS — M545 Low back pain, unspecified: Secondary | ICD-10-CM

## 2024-05-20 DIAGNOSIS — G8929 Other chronic pain: Secondary | ICD-10-CM

## 2024-05-20 DIAGNOSIS — M533 Sacrococcygeal disorders, not elsewhere classified: Secondary | ICD-10-CM

## 2024-05-20 NOTE — Progress Notes (Signed)
 Orthopedic Spine Surgery Office Note  Assessment: Patient is a 23 y.o. female with right sided lower back pain.  No radicular symptoms.  Possible etiologies include early degenerative disc L5/S1 versus right SI joint   Plan: - Patient's MRI shows disc desiccation at L5/S1.  She has tried multiple conservative treatments without any relief.  I do not see any surgical solution for her lumbar spine issue.  However, she does have some physical exam findings pointing towards the SI joint as a possible etiology.  I recommended a diagnostic/therapeutic SI joint injection.  If that does not give her any relief then, I do not see her pain getting any better since she has had it since November 2023 and there have not been any treatment since that have provided her with relief.  At that point, I would recommend seeking new employment where she can perform the job task at hand -Patient should return to office in 5 weeks hopefully after she gets an SI joint injection, x-rays at next visit: None   Patient expressed understanding of the plan and all questions were answered to the patient's satisfaction.   ___________________________________________________________________________   History:  Patient is a 23 y.o. female who presents today for lumbar spine.  Patient has had pain since November 2023.  She says she was at work at the time of onset.  She was between a plastic cart and a metal desk.  She says that a forklift was used to try to move some pallets but in the process it pushed the cart forward and patient was caught between the desk and the cart.  After this incident, she noted onset of lower back pain.  She feels it more on the right side in the lower lumbar region near the SI joint.  She also has pain in the right lateral proximal thigh.  She does not have any pain radiating further down the leg.  She does not have any pain radiating into the left lower extremity.  She has seen several providers and tried  multiple treatments without any relief.   Weakness: Denies Symptoms of imbalance: Denies Paresthesias and numbness: Denies Bowel or bladder incontinence: Denies Saddle anesthesia: Denies  Treatments tried: PT, Tylenol, ibuprofen , oral steroids, lumbar ESI  Review of systems: Denies fevers and chills, night sweats, unexplained weight loss, history of cancer.  Has had pain that wakes her at night  Past medical history: None  Allergies: NKDA  Past surgical history:  Appendectomy C-section  Social history: Denies use of nicotine product (smoking, vaping, patches, smokeless) Alcohol use: Denies Denies recreational drug use   Physical Exam:  BMI of 45.7  General: no acute distress, appears stated age Neurologic: alert, answering questions appropriately, following commands Respiratory: unlabored breathing on room air, symmetric chest rise Psychiatric: appropriate affect, normal cadence to speech   MSK (spine):  -Strength exam      Left  Right EHL    5/5  5/5 TA    5/5  5/5 GSC    5/5  5/5 Knee extension  5/5  5/5 Hip flexion   5/5  5/5  -Sensory exam    Sensation intact to light touch in L3-S1 nerve distributions of bilateral lower extremities  -Achilles DTR: 1/4 on the left, 1/4 on the right -Patellar tendon DTR: 1/4 on the left, 1/4 on the right  -Straight leg raise: negative bilaterally -Clonus: no beats bilaterally  -Left hip exam: No pain to range of motion -Right hip exam: Positive Fortin finger test,  positive SI joint compression test, positive Gaenslen's, negative FABER, negative Stinchfield  Imaging: XRs of the lumbar spine from 05/20/2024 were independently reviewed and interpreted, showing no significant degenerative changes.  No evidence of instability on flexion/extension views.  No fracture or dislocation seen.  MRI of the lumbar spine from 10/15/2022 was independently reviewed and interpreted, showing disc desiccation and small paracentral disc  herniation on the left at L5/S1.  The disc herniation abuts the traversing S1 nerve root.  No other neural compression seen.  No other significant degenerative changes besides the disc desiccation at L5/S1 seen.   Patient name: Belinda May Patient MRN: [id number] Date of visit: 05/20/24

## 2024-07-13 ENCOUNTER — Encounter: Payer: Self-pay | Admitting: Radiology

## 2024-08-27 ENCOUNTER — Other Ambulatory Visit: Payer: Self-pay

## 2024-08-27 ENCOUNTER — Encounter (HOSPITAL_COMMUNITY): Payer: Self-pay

## 2024-08-27 ENCOUNTER — Emergency Department (HOSPITAL_COMMUNITY)
Admission: EM | Admit: 2024-08-27 | Discharge: 2024-08-27 | Disposition: A | Discharge status: Home / Self Care | Payer: Worker's Compensation | Source: Ambulatory Visit | Attending: Emergency Medicine | Admitting: Emergency Medicine

## 2024-08-27 DIAGNOSIS — T7840XA Allergy, unspecified, initial encounter: Secondary | ICD-10-CM | POA: Insufficient documentation

## 2024-08-27 MED ORDER — METHYLPREDNISOLONE 4 MG PO TBPK: ORAL_TABLET | ORAL | 0 refills | Status: AC

## 2024-08-27 NOTE — Discharge Instructions (Signed)
 You have allergic reaction to Ancef.  I have prescribed steroids and you should take it as prescribed  Take Benadryl  25 mg every 6 hours as needed for itchiness  See your doctor for follow-up  Return to ER if you have worse swelling or trouble breathing or rash

## 2024-08-27 NOTE — ED Provider Notes (Signed)
°  [place] EMERGENCY DEPARTMENT AT [facility] Provider Note   CSN: [id number] Arrival date & time: 08/27/24  1448     Patient presents with: Allergic Reaction   Belinda May is a 23 y.o. female.   HPI Patient presents via EMS from our affiliated spine clinic.  Patient was prepping for spine intervention when she received Ancef, subsequently had hives, swollen lips, shortness of breath. EMS reports patient received epinephrine, Solu-Medrol , Benadryl , improved and route.    Prior to Admission medications  Medication Sig Start Date End Date Taking? Authorizing Provider  ibuprofen  (ADVIL ) 600 MG tablet Take 1 tablet (600 mg total) by mouth every 6 (six) hours. 03/14/21   Vertell Recardo HERO, MD  valACYclovir  (VALTREX ) 1000 MG tablet Take 1 tablet (1,000 mg total) by mouth 3 (three) times daily. 05/21/22   Billy Asberry FALCON, PA-C    Allergies: Tylenol [acetaminophen]    Review of Systems  Updated Vital Signs There were no vitals taken for this visit.  Physical Exam Vitals and nursing note reviewed.  Constitutional:      General: She is not in acute distress.    Appearance: She is well-developed.  HENT:     Head: Normocephalic.   Eyes:     Conjunctiva/sclera: Conjunctivae normal.  Cardiovascular:     Rate and Rhythm: Normal rate and regular rhythm.  Pulmonary:     Effort: Pulmonary effort is normal. No respiratory distress.     Breath sounds: Normal breath sounds. No stridor.  Abdominal:     General: There is no distension.  Skin:    General: Skin is warm and dry.  Neurological:     Mental Status: She is alert and oriented to person, place, and time.     Cranial Nerves: No cranial nerve deficit.  Psychiatric:        Mood and Affect: Mood normal.     (all labs ordered are listed, but only abnormal results are displayed) Labs Reviewed - No data to display  EKG: None  Radiology: No results found.   Procedures   Medications Ordered in the ED  - No data to display                                  Medical Decision Making Adult female presents after presumed allergic reaction, having received new medications, developing shortness of breath, facial swelling.  Patient is awake, alert, has improved prior to my arrival having received interventions per EMS. After 1 hour monitoring patient in similar condition. Cardiac 75 sinus normal pulse ox 100% room air normal.  Patient initially alone, subsequently joined by family member and family translator.  Amount and/or Complexity of Data Reviewed Independent Historian: EMS  Risk Decision regarding hospitalization.   On signout, patient awaiting repeat evaluation after initially presenting for likely allergic reaction with substantial improvement after initial interventions per EMS. Dr. Patt is aware.   Final diagnoses:  Allergic reaction, initial encounter     Garrick Charleston, MD 08/27/24 [phone]

## 2024-08-27 NOTE — ED Triage Notes (Signed)
 Pt arrives via [facility] from neurologist's office for allergic reaction. Pt was reportedly having a procedure and received 2 mg versed and then 2 g of Ancef and began having oral swelling, throat tightening, and SOB. Pt received 125 mg solumedrol, 0.3 mg IM epi, 25 mg benadryl  PTA.

## 2024-08-27 NOTE — ED Provider Notes (Signed)
°  Physical Exam  BP 110/82   Pulse (!) 109   Temp 98.7 F (37.1 C)   Resp 18   SpO2 100%   Physical Exam  Procedures  Procedures  ED Course / MDM    Medical Decision Making Care assumed at 4 PM.  Patient is here for anaphylaxis after given Ancef at the spine clinic.  Patient received epi and Benadryl  and Solu-Medrol  prior to arrival.  Signed out pending reassessment  6:55 PM Patient observed for about 4 hours.  Patient is feeling better now and states that swelling has gone down and she has no trouble breathing.  Stable for discharge at this point we will give a course of steroid  Problems Addressed: Allergic reaction, initial encounter: acute illness or injury          Patt Alm Macho, MD 08/27/24 [phone]

## 2024-09-15 ENCOUNTER — Telehealth (HOSPITAL_COMMUNITY): Payer: Self-pay | Admitting: Vascular Surgery

## 2024-09-15 NOTE — Progress Notes (Signed)
 Anesthesia APP: I received a call from Vanessa at [facility]. This patient is being considered for a surgical procedure. She had reportedly indicated she had an allergic reaction to an antibiotic given perioperatively for her 03/12/2021 c-section, so request was to find out what antibiotics she was given. Per Intra-operative anesthesia records she received cefotetan  2g and azithromycin  500 mg IV. I do not see documentation of an allergic reaction with that admission; However, appears she was in the ED on 08/27/2024 after an anaphylactic reaction to Ancef given prior to a neurology/spine procedure. She received 125 mg solumedrol, 0.3 mg IM epi, 25 mg benadryl  PTA to ED. Given Medrol  dose pack at discharge. Neurosurgery staff would have to contact the clinic where patient received the antibiotic for additional details.   Isaiah Ruder, PA-C Surgical Short Stay/Anesthesiology [facility] Phone [phone] [facility] Phone [phone] 09/15/2024 4:25 PM
